# Patient Record
Sex: Female | Born: 1975 | Race: Black or African American | Hispanic: No | Marital: Single | Smoking: Former smoker
Health system: Southern US, Community
[De-identification: ages and names within clinical notes are randomized; demographics above are authoritative.]

## PROBLEM LIST (undated history)

## (undated) DIAGNOSIS — G971 Other reaction to spinal and lumbar puncture: Secondary | ICD-10-CM

## (undated) DIAGNOSIS — E11319 Type 2 diabetes mellitus with unspecified diabetic retinopathy without macular edema: Secondary | ICD-10-CM

## (undated) DIAGNOSIS — E119 Type 2 diabetes mellitus without complications: Secondary | ICD-10-CM

## (undated) DIAGNOSIS — I10 Essential (primary) hypertension: Secondary | ICD-10-CM

## (undated) DIAGNOSIS — H4311 Vitreous hemorrhage, right eye: Secondary | ICD-10-CM

## (undated) DIAGNOSIS — E1139 Type 2 diabetes mellitus with other diabetic ophthalmic complication: Secondary | ICD-10-CM

## (undated) HISTORY — DX: Type 2 diabetes mellitus with unspecified diabetic retinopathy without macular edema: E11.319

## (undated) HISTORY — DX: Essential (primary) hypertension: I10

## (undated) HISTORY — DX: Type 2 diabetes mellitus with other diabetic ophthalmic complication: E11.39

## (undated) HISTORY — DX: Vitreous hemorrhage, right eye: H43.11

## (undated) HISTORY — PX: DILATION AND CURETTAGE OF UTERUS: SHX78

---

## 2000-02-14 ENCOUNTER — Encounter: Admission: RE | Admit: 2000-02-14 | Discharge: 2000-05-14 | Payer: Self-pay | Admitting: Obstetrics and Gynecology

## 2000-02-15 ENCOUNTER — Encounter: Admission: RE | Admit: 2000-02-15 | Discharge: 2000-02-15 | Payer: Self-pay | Admitting: Obstetrics

## 2000-03-07 ENCOUNTER — Encounter: Admission: RE | Admit: 2000-03-07 | Discharge: 2000-03-07 | Payer: Self-pay | Admitting: Obstetrics

## 2000-03-28 ENCOUNTER — Observation Stay (HOSPITAL_COMMUNITY): Admission: AD | Admit: 2000-03-28 | Discharge: 2000-03-29 | Payer: Self-pay | Admitting: Obstetrics

## 2000-04-02 ENCOUNTER — Inpatient Hospital Stay (HOSPITAL_COMMUNITY): Admission: AD | Admit: 2000-04-02 | Discharge: 2000-04-05 | Payer: Self-pay | Admitting: Obstetrics & Gynecology

## 2010-07-06 ENCOUNTER — Ambulatory Visit (HOSPITAL_COMMUNITY): Admission: RE | Admit: 2010-07-06 | Discharge: 2010-07-06 | Payer: Self-pay | Admitting: Obstetrics and Gynecology

## 2011-01-13 NOTE — Discharge Summary (Signed)
[facility]  Patient:    Yvette Patel, Yvette Patel                         MRN: [id number] Adm. Date:  [id number] Disc. Date: [id number] Attending:  Antionette Char Dictator:   Pricilla Holm, M.D.                           Discharge Summary  PROCEDURES:                   Vacuum assisted vaginal delivery of preterm baby at 36 weeks and 3 days.  HOSPITAL COURSE:              The patient is a 35 year old African-American G1, P0 who presented on August 6 to triage with an intrauterine pregnancy of 36-3/7 weeks.  She started having contractions on the night before and spontaneously ruptured her membranes at 11:00 on the morning of August 6. Contractions increased in intensity during that time.  Her pregnancy has been complicated by gestational diabetes requiring insulin.  She was admitted to L&D with routine OB orders and expectant management.  The patient was given some Stadol 1 mg IV x 1 so that she could rest through the night secondary to the increased pain that she was experiencing.  On August 7, she continued to have contractions and made progress very slowly.  Her blood sugars were monitored and she was placed on an insulin protocol.  An IUPC was placed to monitor her active labor, and she was started on Pitocin.  She had an amnio infusion.  The patient was found to be complete and progressed to pushing. The fetus required vacuum assistance.  The placenta was delivered without problems.  The patient delivered a viable female weighing 6 pounds 9 ounces and 20-1/2 inches in length.  Apgars were 8 at one minute and nine at five minutes.  The infant was circumcised.  The patient was given intrapartum antibiotics because she was group B Strep positive.  The patient received routine postpartum care and had no complications and did quite well in recovery.  The patient has been breast feeding without any problems and chooses to use Micronor as birth control during  this time when she will be breast feeding.  DISCHARGE LABORATORY DATA:    Hemoglobin 11.1, hematocrit 34.6.  CONDITION ON DISCHARGE:       Good.  DISPOSITION:                  The patient is discharged to home with the father of the baby and the baby itself.  DISCHARGE MEDICATIONS:        The patient is given a prescription for Micronor to begin one tablet daily starting next Sunday and Motrin 600 mg q.6h. p.r.n. pain.  DISCHARGE INSTRUCTIONS:       1. The patient received instructions on                                  increasing activity as tolerated.                               2. She is to continue with ADA diet per her  diabetes.                               3. She is to continue to check her blood sugars                                  as indicated.                               4. Follow with insulin as needed.                               5. The patient will followup in six weeks at                                  [facility].  NOTE:                         The patient voiced agreement and understanding of the above instructions and had no further questions.  She was discharged to home. DD:  04/05/00 TD:  04/06/00 Job: [id number]

## 2011-01-13 NOTE — Discharge Summary (Signed)
[facility]  Patient:    Yvette Patel, Yvette Patel                         MRN: [id number] Adm. Date:  [id number] Disc. Date: [id number] Attending:  Tammi Sou Dictator:   Pricilla Holm, M.D.                           Discharge Summary  HISTORY OF PRESENT ILLNESS:   This is a 35 year old, G1, P0, who is 35-5/[redacted] weeks pregnant, consistent with the first ultrasound that she had on November 17, 1999.  She says that she was having some discharge, as well as some contractions during the day.  She came to the maternity admissions unit to be evaluated.  She is a gestational diabetic, which has required insulin.  The patient has been taking prenatal vitamins, iron, and Insulin N 10 units in the morning and 25 units q.h.s.  The patient had several labs run upon presentation.  Wet prep was negative and urine was negative at that time.  The patients cervical check was consistent with a cervix that was closed, long, thickened and posterior.  While in triage, the patient received one dose of terbutaline subcu and continued to have contractions, which started to worsen in intensity.  She was given a second dose of terbutaline and was admitted to antepartum for continuous monitoring.  The patient was also given approximately a liter of normal saline fluid.  HOSPITAL COURSE:              She was started on Biaxin XL and was monitored continuously through the night.  The patient did well through the night and contractions subsided early in the evening shortly after being admitted.  The patient has a GC and chlamydia, as well as group B streptococcus labs currently.  The patient will be sent home with instructions to minimize her activity, continue to drink plenty of fluids, and continue to take her prenatal vitamins as directed.  CONDITION ON DISCHARGE:       Good.  DISPOSITION:                  The patient is discharged to home.  The patient voiced agreement and  understanding of the discharge instructions and had no further question.  DISCHARGE MEDICATIONS:        The patient will continue with her prenatal vitamins.  She will continue with her insulin for her diabetes.  ACTIVITY:                     As stated above, the patient will have limited physical activity.  DIET:                         Normal ADA diet.  FOLLOW-UP:                    The patient will follow up next week with Conni Elliot, M.D., with an appointment that she already had established. DD:  03/29/00 TD:  03/31/00 Job: [id number]

## 2011-04-12 ENCOUNTER — Encounter: Payer: Self-pay | Admitting: Family Medicine

## 2011-04-12 ENCOUNTER — Emergency Department (HOSPITAL_BASED_OUTPATIENT_CLINIC_OR_DEPARTMENT_OTHER)
Admission: EM | Admit: 2011-04-12 | Discharge: 2011-04-12 | Disposition: A | Discharge status: Home / Self Care | Payer: Self-pay | Attending: Emergency Medicine | Admitting: Emergency Medicine

## 2011-04-12 DIAGNOSIS — F17200 Nicotine dependence, unspecified, uncomplicated: Secondary | ICD-10-CM | POA: Insufficient documentation

## 2011-04-12 DIAGNOSIS — M62838 Other muscle spasm: Secondary | ICD-10-CM

## 2011-04-12 DIAGNOSIS — M5380 Other specified dorsopathies, site unspecified: Secondary | ICD-10-CM | POA: Insufficient documentation

## 2011-04-12 DIAGNOSIS — M549 Dorsalgia, unspecified: Secondary | ICD-10-CM | POA: Insufficient documentation

## 2011-04-12 MED ORDER — CYCLOBENZAPRINE HCL 10 MG PO TABS: 10.0000 mg | ORAL_TABLET | Freq: Two times a day (BID) | ORAL | Status: AC | PRN

## 2011-04-12 MED ORDER — IBUPROFEN 800 MG PO TABS: 800.0000 mg | ORAL_TABLET | Freq: Three times a day (TID) | ORAL | Status: AC

## 2011-04-12 NOTE — ED Provider Notes (Signed)
History     CSN: [id number] Arrival date & time: 04/12/2011 11:04 AM  Chief Complaint  Patient presents with  . Back Pain   Patient is a 35 y.o. female presenting with back pain. The history is provided by the patient.  Back Pain  This is a new problem. Episode onset: 3 days ago. The problem occurs constantly. The problem has not changed since onset.Associated with: no known injury. Pain location: lower back, L side. The quality of the pain is described as aching. The pain does not radiate. The pain is at a severity of 7/10. The symptoms are aggravated by bending, twisting and certain positions. The pain is worse during the day. Pertinent negatives include no chest pain, no fever, no numbness, no headaches, no abdominal pain, no bowel incontinence, no perianal numbness, no bladder incontinence, no dysuria, no leg pain and no weakness. She has tried NSAIDs (tylenol) for the symptoms. The treatment provided mild relief.    History reviewed. No pertinent past medical history.  Past Surgical History  Procedure Date  . Cesarean section     No family history on file.  History  Substance Use Topics  . Smoking status: Current Everyday Smoker -- 0.5 packs/day for 10 years    Types: Cigarettes  . Smokeless tobacco: Not on file  . Alcohol Use: No    OB History    Grav Para Term Preterm Abortions TAB SAB Ect Mult Living                  Review of Systems  Constitutional: Negative for fever.  Cardiovascular: Negative for chest pain.  Gastrointestinal: Negative for abdominal pain and bowel incontinence.  Genitourinary: Negative for bladder incontinence and dysuria.  Musculoskeletal: Positive for back pain.  Neurological: Negative for weakness, numbness and headaches.    Physical Exam  BP 157/80  Pulse 83  Temp(Src) 97.8 F (36.6 C) (Oral)  Resp 16  Wt 200 lb (90.719 kg)  SpO2 100%  LMP 04/07/2011  Physical Exam  Nursing note and vitals reviewed. Constitutional: She appears  well-developed and well-nourished. No distress.  HENT:  Head: Normocephalic and atraumatic.  Eyes: Conjunctivae are normal. No scleral icterus.  Neck: Normal range of motion. Neck supple. No thyromegaly present.  Cardiovascular: Normal rate and regular rhythm.   Pulmonary/Chest: Effort normal and breath sounds normal.  Musculoskeletal: Normal range of motion. She exhibits tenderness (Mild tenderness to palpation over the left rhomboid, left lateral back, left lower back. There is no spinal tenderness and no right-sided tenderness. She positions her body such that she favors the musculature of her left back.). She exhibits no edema.  Lymphadenopathy:    She has no cervical adenopathy.  Neurological: She is alert.       Strength and sensation In the bilateral lower extremities is normal. Gait is normal, speech is normal  Skin: Skin is warm and dry. No rash noted. She is not diaphoretic.       No rash, no abscess of the back.    ED Course  Procedures  MDM Overall well-appearing, suspect some muscle spasm in her back. Patient declines intramuscular medications here, prescription is given for home. No neurologic symptoms and no red flags for back pain.      Vida Roller, MD 04/12/11 [phone]

## 2011-04-12 NOTE — ED Notes (Signed)
Pt c/o left low-mid back pain x 3 days and a "knot" that came up "about the same time". Pt denies injury. Pt sts no relief at home with epsom salt, heating pad and tylenol.

## 2011-09-28 ENCOUNTER — Emergency Department (HOSPITAL_BASED_OUTPATIENT_CLINIC_OR_DEPARTMENT_OTHER)
Admission: EM | Admit: 2011-09-28 | Discharge: 2011-09-28 | Disposition: A | Discharge status: Home / Self Care | Payer: Self-pay | Attending: Emergency Medicine | Admitting: Emergency Medicine

## 2011-09-28 ENCOUNTER — Encounter (HOSPITAL_BASED_OUTPATIENT_CLINIC_OR_DEPARTMENT_OTHER): Payer: Self-pay | Admitting: *Deleted

## 2011-09-28 DIAGNOSIS — K1370 Unspecified lesions of oral mucosa: Secondary | ICD-10-CM | POA: Insufficient documentation

## 2011-09-28 DIAGNOSIS — K047 Periapical abscess without sinus: Secondary | ICD-10-CM | POA: Insufficient documentation

## 2011-09-28 DIAGNOSIS — F17200 Nicotine dependence, unspecified, uncomplicated: Secondary | ICD-10-CM | POA: Insufficient documentation

## 2011-09-28 MED ORDER — CLINDAMYCIN HCL 150 MG PO CAPS: 150.0000 mg | ORAL_CAPSULE | Freq: Four times a day (QID) | ORAL | Status: AC

## 2011-09-28 MED ORDER — HYDROCODONE-ACETAMINOPHEN 5-500 MG PO TABS: 1.0000 | ORAL_TABLET | Freq: Four times a day (QID) | ORAL | Status: AC | PRN

## 2011-09-28 MED ORDER — CLINDAMYCIN HCL 150 MG PO CAPS: 150.0000 mg | ORAL_CAPSULE | Freq: Four times a day (QID) | ORAL | Status: DC

## 2011-09-28 NOTE — ED Provider Notes (Signed)
History     CSN: [id number]  Arrival date & time 09/28/11  1813   First MD Initiated Contact with Patient 09/28/11 1926      Chief Complaint  Patient presents with  . Oral Swelling    (Consider location/radiation/quality/duration/timing/severity/associated sxs/prior treatment) HPI Comments: Pt states that she noticed some swelling to the roof of her mouth  Patient is a 36 y.o. female presenting with tooth pain. The history is provided by the patient. No language interpreter was used.  Dental PainThe primary symptoms include mouth pain. The symptoms began 5 to 7 days ago. The symptoms are worsening. The symptoms are new. The symptoms occur constantly.  Additional symptoms do not include: trouble swallowing.    History reviewed. No pertinent past medical history.  Past Surgical History  Procedure Date  . Cesarean section     No family history on file.  History  Substance Use Topics  . Smoking status: Current Everyday Smoker -- 0.5 packs/day for 10 years    Types: Cigarettes  . Smokeless tobacco: Not on file  . Alcohol Use: No    OB History    Grav Para Term Preterm Abortions TAB SAB Ect Mult Living                  Review of Systems  Constitutional: Negative.   HENT: Negative for trouble swallowing.   Respiratory: Negative.   Cardiovascular: Negative.   Neurological: Negative.     Allergies  Chlorine; Mushroom ext cmplx(shiitake-reishi-mait); Sulfa antibiotics; Citric acid; and Penicillins  Home Medications   Current Outpatient Rx  Name Route Sig Dispense Refill  . ACETAMINOPHEN 500 MG PO TABS Oral Take 2,000 mg by mouth 3 (three) times daily as needed. For pain    . HYDROCODONE-ACETAMINOPHEN 7.5-500 MG PO TABS Oral Take 3 tablets by mouth once as needed. For pain    . NAPROXEN SODIUM 220 MG PO TABS Oral Take 880 mg by mouth 3 (three) times daily as needed. For pain    . CLINDAMYCIN HCL 150 MG PO CAPS Oral Take 1 capsule (150 mg total) by mouth every 6  (six) hours. 28 capsule 0  . HYDROCODONE-ACETAMINOPHEN 5-500 MG PO TABS Oral Take 1-2 tablets by mouth every 6 (six) hours as needed for pain. 6 tablet 0    BP 153/83  Pulse 88  Temp(Src) 98.6 F (37 C) (Oral)  Resp 20  SpO2 100%  Physical Exam  Nursing note and vitals reviewed. Constitutional: She is oriented to person, place, and time. She appears well-developed and well-nourished.  HENT:  Head: Atraumatic.  Right Ear: External ear normal.  Left Ear: External ear normal.  Mouth/Throat:    Eyes: EOM are normal.  Neck: Neck supple.  Cardiovascular: Normal rate and regular rhythm.   Pulmonary/Chest: Effort normal and breath sounds normal.  Musculoskeletal: Normal range of motion.  Neurological: She is alert and oriented to person, place, and time.    ED Course  Procedures (including critical care time)  Labs Reviewed - No data to display No results found.   1. Dental abscess       MDM  Will treat with antibiotics and refer to dentist        Teressa Lower, NP 09/28/11 2018

## 2011-09-28 NOTE — ED Provider Notes (Signed)
Medical screening examination/treatment/procedure(s) were performed by non-physician practitioner and as supervising physician I was immediately available for consultation/collaboration.   Kori Colin, MD 09/28/11 2354 

## 2011-09-28 NOTE — ED Notes (Signed)
Knot in the roof of her mouth x 1 week. Worse every day.

## 2012-04-18 ENCOUNTER — Emergency Department (HOSPITAL_BASED_OUTPATIENT_CLINIC_OR_DEPARTMENT_OTHER)
Admission: EM | Admit: 2012-04-18 | Discharge: 2012-04-18 | Disposition: A | Discharge status: Home / Self Care | Payer: Self-pay | Attending: Emergency Medicine | Admitting: Emergency Medicine

## 2012-04-18 ENCOUNTER — Encounter (HOSPITAL_BASED_OUTPATIENT_CLINIC_OR_DEPARTMENT_OTHER): Payer: Self-pay | Admitting: *Deleted

## 2012-04-18 DIAGNOSIS — Z888 Allergy status to other drugs, medicaments and biological substances status: Secondary | ICD-10-CM | POA: Insufficient documentation

## 2012-04-18 DIAGNOSIS — Z882 Allergy status to sulfonamides status: Secondary | ICD-10-CM | POA: Insufficient documentation

## 2012-04-18 DIAGNOSIS — M4802 Spinal stenosis, cervical region: Secondary | ICD-10-CM | POA: Insufficient documentation

## 2012-04-18 DIAGNOSIS — H109 Unspecified conjunctivitis: Secondary | ICD-10-CM

## 2012-04-18 DIAGNOSIS — Z91018 Allergy to other foods: Secondary | ICD-10-CM | POA: Insufficient documentation

## 2012-04-18 DIAGNOSIS — F17200 Nicotine dependence, unspecified, uncomplicated: Secondary | ICD-10-CM | POA: Insufficient documentation

## 2012-04-18 DIAGNOSIS — Z9109 Other allergy status, other than to drugs and biological substances: Secondary | ICD-10-CM | POA: Insufficient documentation

## 2012-04-18 DIAGNOSIS — Z880 Allergy status to penicillin: Secondary | ICD-10-CM | POA: Insufficient documentation

## 2012-04-18 MED ORDER — FLUORESCEIN SODIUM 1 MG OP STRP
ORAL_STRIP | OPHTHALMIC | Status: AC
  Administered 2012-04-18: 1
  Filled 2012-04-18: qty 1

## 2012-04-18 MED ORDER — TETRACAINE HCL 0.5 % OP SOLN
OPHTHALMIC | Status: AC
  Filled 2012-04-18: qty 2

## 2012-04-18 MED ORDER — HYDROCODONE-ACETAMINOPHEN 5-325 MG PO TABS: 2.0000 | ORAL_TABLET | ORAL | Status: AC | PRN

## 2012-04-18 MED ORDER — CIPROFLOXACIN HCL 0.3 % OP SOLN: 1.0000 [drp] | OPHTHALMIC | Status: AC

## 2012-04-18 NOTE — ED Provider Notes (Signed)
History     CSN: [id number]  Arrival date & time 04/18/12  1258   First MD Initiated Contact with Patient 04/18/12 1358      Chief Complaint  Patient presents with  . Eye Pain  . Eye Drainage    (Consider location/radiation/quality/duration/timing/severity/associated sxs/prior treatment) Patient is a 36 y.o. female presenting with eye pain. The history is provided by the patient. No language interpreter was used.  Eye Pain This is a new problem. The current episode started yesterday. The problem occurs constantly. The problem has been gradually worsening. Nothing aggravates the symptoms. She has tried nothing for the symptoms. The treatment provided moderate relief.  Pt complains of pain and drainage from her left eye.  Pt reports she noticed soreness after dust mopping.   Pt works in a daycare.   Pt has been exposed to pink eye  History reviewed. No pertinent past medical history.  Past Surgical History  Procedure Date  . Cesarean section     History reviewed. No pertinent family history.  History  Substance Use Topics  . Smoking status: Current Everyday Smoker -- 0.5 packs/day for 10 years    Types: Cigarettes  . Smokeless tobacco: Not on file  . Alcohol Use: No    OB History    Grav Para Term Preterm Abortions TAB SAB Ect Mult Living                  Review of Systems  Eyes: Positive for pain, discharge and redness.  All other systems reviewed and are negative.    Allergies  Chlorine; Mushroom ext cmplx(shiitake-reishi-mait); Sulfa antibiotics; Citric acid; and Penicillins  Home Medications   Current Outpatient Rx  Name Route Sig Dispense Refill  . ACETAMINOPHEN 500 MG PO TABS Oral Take 2,000 mg by mouth 3 (three) times daily as needed. For pain    . HYDROCODONE-ACETAMINOPHEN 7.5-500 MG PO TABS Oral Take 3 tablets by mouth once as needed. For pain    . NAPROXEN SODIUM 220 MG PO TABS Oral Take 880 mg by mouth 3 (three) times daily as needed. For pain       BP 131/73  Pulse 89  Temp 98.2 F (36.8 C) (Oral)  Resp 16  Ht 5\' 9"  (1.753 m)  Wt 190 lb (86.183 kg)  BMI 28.06 kg/m2  LMP 04/13/2012  Physical Exam  Nursing note and vitals reviewed. Constitutional: She is oriented to person, place, and time. She appears well-developed and well-nourished.  HENT:  Head: Normocephalic and atraumatic.  Eyes: EOM are normal. Pupils are equal, round, and reactive to light. Right eye exhibits discharge.       Exudate lower eyelid,   fluro no foreign body.   Neck: Normal range of motion. Neck supple.  Musculoskeletal: Normal range of motion.  Neurological: She is alert and oriented to person, place, and time. She has normal reflexes.  Skin: Skin is warm.  Psychiatric: She has a normal mood and affect.    ED Course  Procedures (including critical care time)  Labs Reviewed - No data to display No results found.   No diagnosis found.    MDM  I will treat with ciloxan and hydrocodone.  Pt advised to see her MD for recheck in 2 days        Yvette Patel, [place] 04/18/12 1428

## 2012-04-18 NOTE — ED Provider Notes (Signed)
Medical screening examination/treatment/procedure(s) were performed by non-physician practitioner and as supervising physician I was immediately available for consultation/collaboration.   Charles B. Bernette Mayers, MD 04/18/12 1429

## 2012-04-18 NOTE — ED Notes (Signed)
Pt states that two days ago at work she was sweeping at work and dust got into her eye.  She irrigated the eye.  Began experiencing pain and swelling.  Pt states that she is not able to open the left eye today and there is clear discharge coming from the left eye.  Pt states that she is light sensitive but denies nausea.  States that she will occasionally experience dizziness.
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
## 2015-04-17 ENCOUNTER — Emergency Department (HOSPITAL_BASED_OUTPATIENT_CLINIC_OR_DEPARTMENT_OTHER)
Admission: EM | Admit: 2015-04-17 | Discharge: 2015-04-18 | Disposition: A | Discharge status: Home / Self Care | Payer: No Typology Code available for payment source | Attending: Emergency Medicine | Admitting: Emergency Medicine

## 2015-04-17 ENCOUNTER — Encounter (HOSPITAL_BASED_OUTPATIENT_CLINIC_OR_DEPARTMENT_OTHER): Payer: Self-pay | Admitting: *Deleted

## 2015-04-17 DIAGNOSIS — O200 Threatened abortion: Secondary | ICD-10-CM | POA: Insufficient documentation

## 2015-04-17 DIAGNOSIS — Z3A10 10 weeks gestation of pregnancy: Secondary | ICD-10-CM | POA: Insufficient documentation

## 2015-04-17 DIAGNOSIS — Z880 Allergy status to penicillin: Secondary | ICD-10-CM | POA: Insufficient documentation

## 2015-04-17 DIAGNOSIS — O24111 Pre-existing diabetes mellitus, type 2, in pregnancy, first trimester: Secondary | ICD-10-CM | POA: Diagnosis not present

## 2015-04-17 DIAGNOSIS — Z87891 Personal history of nicotine dependence: Secondary | ICD-10-CM | POA: Insufficient documentation

## 2015-04-17 DIAGNOSIS — O039 Complete or unspecified spontaneous abortion without complication: Secondary | ICD-10-CM

## 2015-04-17 HISTORY — DX: Type 2 diabetes mellitus without complications: E11.9

## 2015-04-17 LAB — URINALYSIS, ROUTINE W REFLEX MICROSCOPIC
Bilirubin Urine: NEGATIVE
KETONES UR: NEGATIVE mg/dL
NITRITE: NEGATIVE
PH: 6 (ref 5.0–8.0)
Protein, ur: 100 mg/dL — AB
SPECIFIC GRAVITY, URINE: 1.029 (ref 1.005–1.030)
Urobilinogen, UA: 1 mg/dL (ref 0.0–1.0)

## 2015-04-17 LAB — URINE MICROSCOPIC-ADD ON

## 2015-04-17 LAB — PREGNANCY, URINE: PREG TEST UR: NEGATIVE

## 2015-04-17 NOTE — ED Notes (Signed)
Pt states that she is [redacted] weeks pregnant and has been bleeding today. States she has soaked "one pad" since this morning. States this is the first time she has had any vaginal bleeding with pregnancy. C/o abd cramping.

## 2015-04-18 ENCOUNTER — Ambulatory Visit (HOSPITAL_BASED_OUTPATIENT_CLINIC_OR_DEPARTMENT_OTHER)
Admit: 2015-04-18 | Discharge: 2015-04-18 | Disposition: A | Discharge status: Home / Self Care | Payer: No Typology Code available for payment source | Attending: Emergency Medicine | Admitting: Emergency Medicine

## 2015-04-18 ENCOUNTER — Other Ambulatory Visit (HOSPITAL_BASED_OUTPATIENT_CLINIC_OR_DEPARTMENT_OTHER): Payer: Self-pay | Admitting: Emergency Medicine

## 2015-04-18 DIAGNOSIS — N939 Abnormal uterine and vaginal bleeding, unspecified: Secondary | ICD-10-CM

## 2015-04-18 LAB — HCG, QUANTITATIVE, PREGNANCY

## 2015-04-18 LAB — ABO/RH: ABO/RH(D): A POS

## 2015-04-18 NOTE — ED Provider Notes (Signed)
CSN: [id number]     Arrival date & time 04/17/15  2311 History   First MD Initiated Contact with Patient 04/18/15 0157     Chief Complaint  Patient presents with  . Vaginal Bleeding     (Consider location/radiation/quality/duration/timing/severity/associated sxs/prior Treatment) HPI  This is a 39 year old female who who states she is about [redacted] weeks pregnant. She developed vaginal bleeding yesterday which was accompanied by passage of a mass of tissue as well as clots. There was moderate to severe associated menstrual-like cramping. The cramping has subsequently improved and her bleeding has slowed down. She states she has only used one pad. Bleeding is currently less heavy than a period but was more severe yesterday.  Past Medical History  Diagnosis Date  . Diabetes mellitus without complication    Past Surgical History  Procedure Laterality Date  . Cesarean section     No family history on file. Social History  Substance Use Topics  . Smoking status: Former Smoker -- 0.00 packs/day for 0 years  . Smokeless tobacco: None  . Alcohol Use: No   OB History    No data available     Review of Systems  All other systems reviewed and are negative.   Allergies  Chlorine; Mushroom ext cmplx(shiitake-reishi-mait); Sulfa antibiotics; Citric acid; and Penicillins  Home Medications   Prior to Admission medications   Medication Sig Start Date End Date Taking? Authorizing Provider  Prenatal Vit-Fe Fumarate-FA (PRENATAL VITAMIN PO) Take by mouth.   Yes Historical Provider, MD  acetaminophen (TYLENOL) 500 MG tablet Take 2,000 mg by mouth 3 (three) times daily as needed. For pain    Historical Provider, MD  HYDROcodone-acetaminophen (LORTAB) 7.5-500 MG per tablet Take 3 tablets by mouth once as needed. For pain    Historical Provider, MD  naproxen sodium (ANAPROX) 220 MG tablet Take 880 mg by mouth 3 (three) times daily as needed. For pain    Historical Provider, MD   BP 168/71 mmHg   Pulse 74  Temp(Src) 97.7 F (36.5 C) (Oral)  Resp 18  Ht 5\' 10"  (1.778 m)  Wt 195 lb (88.451 kg)  BMI 27.98 kg/m2  SpO2 99%  LMP 12/27/2014   Physical Exam  General: Well-developed, well-nourished female in no acute distress; appearance consistent with age of record HENT: normocephalic; atraumatic Eyes: pupils equal, round and reactive to light; extraocular muscles intact Neck: supple Heart: regular rate and rhythm Lungs: clear to auscultation bilaterally Abdomen: soft; nondistended; mild suprapubic tenderness; no masses or hepatosplenomegaly; bowel sounds present GU: No CVA tenderness; no IUP seen on bedside ultrasound Extremities: No deformity; full range of motion; pulses normal Neurologic: Awake, alert and oriented; motor function intact in all extremities and symmetric; no facial droop Skin: Warm and dry Psychiatric: Normal mood and affect    ED Course  Procedures (including critical care time)   MDM   Nursing notes and vitals signs, including pulse oximetry, reviewed.  Summary of this visit's results, reviewed by myself:  Labs:  Results for orders placed or performed during the hospital encounter of 04/17/15 (from the past 24 hour(s))  Pregnancy, urine     Status: None   Collection Time: 04/17/15 11:00 PM  Result Value Ref Range   Preg Test, Ur NEGATIVE NEGATIVE  Urinalysis, Routine w reflex microscopic (not at [facility])     Status: Abnormal   Collection Time: 04/17/15 11:00 PM  Result Value Ref Range   Color, Urine YELLOW YELLOW   APPearance CLEAR CLEAR   Specific  Gravity, Urine 1.029 1.005 - 1.030   pH 6.0 5.0 - 8.0   Glucose, UA >1000 (A) NEGATIVE mg/dL   Hgb urine dipstick LARGE (A) NEGATIVE   Bilirubin Urine NEGATIVE NEGATIVE   Ketones, ur NEGATIVE NEGATIVE mg/dL   Protein, ur 161 (A) NEGATIVE mg/dL   Urobilinogen, UA 1.0 0.0 - 1.0 mg/dL   Nitrite NEGATIVE NEGATIVE   Leukocytes, UA SMALL (A) NEGATIVE  Urine microscopic-add on     Status: Abnormal    Collection Time: 04/17/15 11:00 PM  Result Value Ref Range   Squamous Epithelial / LPF RARE RARE   WBC, UA 7-10 <3 WBC/hpf   RBC / HPF 11-20 <3 RBC/hpf   Bacteria, UA FEW (A) RARE   Urine-Other MUCOUS PRESENT   hCG, quantitative, pregnancy     Status: None   Collection Time: 04/18/15 12:15 AM  Result Value Ref Range   hCG, Beta Chain, Quant, S <1 <5 mIU/mL   Suspect intrauterine fetal demise with delayed passage of products of conception. We will have her return for formal ultrasound later today.     Paula Libra, MD 04/18/15 [phone]

## 2015-04-18 NOTE — Discharge Instructions (Signed)
Miscarriage A miscarriage is the sudden loss of an unborn baby (fetus) before the 20th week of pregnancy. Most miscarriages happen in the first 3 months of pregnancy. Sometimes, it happens before a woman even knows she is pregnant. A miscarriage is also called a "spontaneous miscarriage" or "early pregnancy loss." Having a miscarriage can be an emotional experience. Talk with your caregiver about any questions you may have about miscarrying, the grieving process, and your future pregnancy plans. CAUSES   Problems with the fetal chromosomes that make it impossible for the baby to develop normally. Problems with the baby's genes or chromosomes are most often the result of errors that occur, by chance, as the embryo divides and grows. The problems are not inherited from the parents.  Infection of the cervix or uterus.   Hormone problems.   Problems with the cervix, such as having an incompetent cervix. This is when the tissue in the cervix is not strong enough to hold the pregnancy.   Problems with the uterus, such as an abnormally shaped uterus, uterine fibroids, or congenital abnormalities.   Certain medical conditions.   Smoking, drinking alcohol, or taking illegal drugs.   Trauma.  Often, the cause of a miscarriage is unknown.  SYMPTOMS   Vaginal bleeding or spotting, with or without cramps or pain.  Pain or cramping in the abdomen or lower back.  Passing fluid, tissue, or blood clots from the vagina. DIAGNOSIS  Your caregiver will perform a physical exam. You may also have an ultrasound to confirm the miscarriage. Blood or urine tests may also be ordered. TREATMENT   Sometimes, treatment is not necessary if you naturally pass all the fetal tissue that was in the uterus. If some of the fetus or placenta remains in the body (incomplete miscarriage), tissue left behind may become infected and must be removed. Usually, a dilation and curettage (D and C) procedure is performed.  During a D and C procedure, the cervix is widened (dilated) and any remaining fetal or placental tissue is gently removed from the uterus.  Antibiotic medicines are prescribed if there is an infection. Other medicines may be given to reduce the size of the uterus (contract) if there is a lot of bleeding.  If you have Rh negative blood and your baby was Rh positive, you will need a Rh immunoglobulin shot. This shot will protect any future baby from having Rh blood problems in future pregnancies. HOME CARE INSTRUCTIONS   Your caregiver may order bed rest or may allow you to continue light activity. Resume activity as directed by your caregiver.  Have someone help with home and family responsibilities during this time.   Keep track of the number of sanitary pads you use each day and how soaked (saturated) they are. Write down this information.   Do not use tampons. Do not douche or have sexual intercourse until approved by your caregiver.   Only take over-the-counter or prescription medicines for pain or discomfort as directed by your caregiver.   Do not take aspirin. Aspirin can cause bleeding.   Keep all follow-up appointments with your caregiver.   If you or your partner have problems with grieving, talk to your caregiver or seek counseling to help cope with the pregnancy loss. Allow enough time to grieve before trying to get pregnant again.  SEEK IMMEDIATE MEDICAL CARE IF:   You have severe cramps or pain in your back or abdomen.  You have a fever.  You pass large blood clots (walnut-sized   or larger) ortissue from your vagina. Save any tissue for your caregiver to inspect.   Your bleeding increases.   You have a thick, bad-smelling vaginal discharge.  You become lightheaded, weak, or you faint.   You have chills.  MAKE SURE YOU:  Understand these instructions.  Will watch your condition.  Will get help right away if you are not doing well or get  worse. Document Released: 02/07/2001 Document Revised: 12/09/2012 Document Reviewed: 10/03/2011 [employer] Patient Information 2015 [employer]. This information is not intended to replace advice given to you by your health care provider. Make sure you discuss any questions you have with your health care provider.  

## 2015-04-26 ENCOUNTER — Encounter (HOSPITAL_BASED_OUTPATIENT_CLINIC_OR_DEPARTMENT_OTHER): Payer: Self-pay | Admitting: *Deleted

## 2015-04-26 ENCOUNTER — Emergency Department (HOSPITAL_BASED_OUTPATIENT_CLINIC_OR_DEPARTMENT_OTHER)
Admission: EM | Admit: 2015-04-26 | Discharge: 2015-04-26 | Disposition: A | Discharge status: Home / Self Care | Payer: No Typology Code available for payment source | Attending: Emergency Medicine | Admitting: Emergency Medicine

## 2015-04-26 DIAGNOSIS — Y92410 Unspecified street and highway as the place of occurrence of the external cause: Secondary | ICD-10-CM | POA: Diagnosis not present

## 2015-04-26 DIAGNOSIS — Y9389 Activity, other specified: Secondary | ICD-10-CM | POA: Diagnosis not present

## 2015-04-26 DIAGNOSIS — Z87891 Personal history of nicotine dependence: Secondary | ICD-10-CM | POA: Insufficient documentation

## 2015-04-26 DIAGNOSIS — E119 Type 2 diabetes mellitus without complications: Secondary | ICD-10-CM | POA: Diagnosis not present

## 2015-04-26 DIAGNOSIS — Y998 Other external cause status: Secondary | ICD-10-CM | POA: Diagnosis not present

## 2015-04-26 DIAGNOSIS — S299XXA Unspecified injury of thorax, initial encounter: Secondary | ICD-10-CM | POA: Insufficient documentation

## 2015-04-26 DIAGNOSIS — Z880 Allergy status to penicillin: Secondary | ICD-10-CM | POA: Insufficient documentation

## 2015-04-26 DIAGNOSIS — S3992XA Unspecified injury of lower back, initial encounter: Secondary | ICD-10-CM | POA: Diagnosis not present

## 2015-04-26 DIAGNOSIS — M549 Dorsalgia, unspecified: Secondary | ICD-10-CM
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
## 2016-02-18 ENCOUNTER — Encounter (HOSPITAL_COMMUNITY): Payer: Self-pay

## 2016-02-18 DIAGNOSIS — R112 Nausea with vomiting, unspecified: Secondary | ICD-10-CM | POA: Insufficient documentation

## 2016-02-18 DIAGNOSIS — R197 Diarrhea, unspecified: Secondary | ICD-10-CM | POA: Insufficient documentation

## 2016-02-18 DIAGNOSIS — Z87891 Personal history of nicotine dependence: Secondary | ICD-10-CM | POA: Insufficient documentation

## 2016-02-18 DIAGNOSIS — E119 Type 2 diabetes mellitus without complications: Secondary | ICD-10-CM | POA: Insufficient documentation

## 2016-02-18 LAB — CBC
HCT: 34.2 % — ABNORMAL LOW (ref 36.0–46.0)
HEMOGLOBIN: 11.1 g/dL — AB (ref 12.0–15.0)
MCH: 26.4 pg (ref 26.0–34.0)
MCHC: 32.5 g/dL (ref 30.0–36.0)
MCV: 81.4 fL (ref 78.0–100.0)
Platelets: 157 10*3/uL (ref 150–400)
RBC: 4.2 MIL/uL (ref 3.87–5.11)
RDW: 13.7 % (ref 11.5–15.5)
WBC: 7.9 10*3/uL (ref 4.0–10.5)

## 2016-02-18 LAB — URINE MICROSCOPIC-ADD ON

## 2016-02-18 LAB — COMPREHENSIVE METABOLIC PANEL
ALBUMIN: 3.7 g/dL (ref 3.5–5.0)
ALK PHOS: 47 U/L (ref 38–126)
ALT: 23 U/L (ref 14–54)
ANION GAP: 8 (ref 5–15)
AST: 26 U/L (ref 15–41)
BUN: 11 mg/dL (ref 6–20)
CALCIUM: 9.5 mg/dL (ref 8.9–10.3)
CHLORIDE: 107 mmol/L (ref 101–111)
CO2: 25 mmol/L (ref 22–32)
CREATININE: 0.86 mg/dL (ref 0.44–1.00)
GFR calc Af Amer: >60 mL/min (ref >60)
GFR calc non Af Amer: >60 mL/min (ref >60)
GLUCOSE: 148 mg/dL — AB (ref 65–99)
Potassium: 3.8 mmol/L (ref 3.5–5.1)
SODIUM: 140 mmol/L (ref 135–145)
Total Bilirubin: 0.5 mg/dL (ref 0.3–1.2)
Total Protein: 6.9 g/dL (ref 6.5–8.1)

## 2016-02-18 LAB — POC URINE PREG, ED: PREG TEST UR: NEGATIVE

## 2016-02-18 LAB — URINALYSIS, ROUTINE W REFLEX MICROSCOPIC
BILIRUBIN URINE: NEGATIVE
GLUCOSE, UA: NEGATIVE mg/dL
Ketones, ur: 15 mg/dL — AB
Leukocytes, UA: NEGATIVE
Nitrite: NEGATIVE
PH: 5.5 (ref 5.0–8.0)
Protein, ur: 100 mg/dL — AB
SPECIFIC GRAVITY, URINE: 1.028 (ref 1.005–1.030)

## 2016-02-18 LAB — LIPASE, BLOOD: Lipase: 42 U/L (ref 11–51)

## 2016-02-18 NOTE — ED Notes (Signed)
Pt states she ate a hotdog today and noticed that the bread was molded; Pt states a few hours after she started vomiting and having diarrhea. Pt states he vomited x 4 and diarrhea x 3; Pt states she feels tightness in abdomen; pt states pain at 8/10 on arrival; pt a&o x 4 on arrival. Pt states she feel like her throat is swollen; pt states she is able to swallow but does have some throat discomfort.

## 2016-02-19 ENCOUNTER — Emergency Department (HOSPITAL_COMMUNITY)
Admission: EM | Admit: 2016-02-19 | Discharge: 2016-02-19 | Disposition: A | Discharge status: Home / Self Care | Payer: No Typology Code available for payment source | Attending: Emergency Medicine | Admitting: Emergency Medicine

## 2016-02-19 DIAGNOSIS — R197 Diarrhea, unspecified: Secondary | ICD-10-CM

## 2016-02-19 DIAGNOSIS — R112 Nausea with vomiting, unspecified: Secondary | ICD-10-CM

## 2016-02-19 MED ORDER — ONDANSETRON 8 MG PO TBDP: 8.0000 mg | ORAL_TABLET | Freq: Three times a day (TID) | ORAL | Status: DC | PRN

## 2016-02-19 MED ORDER — ONDANSETRON 4 MG PO TBDP
8.0000 mg | ORAL_TABLET | Freq: Once | ORAL | Status: AC
  Administered 2016-02-19: 8 mg via ORAL
  Filled 2016-02-19: qty 2

## 2016-02-19 NOTE — ED Provider Notes (Signed)
CSN: [id number]     Arrival date & time 02/18/16  2125 History   First MD Initiated Contact with Patient 02/19/16 0225     Chief Complaint  Patient presents with  . Abdominal Pain  . Nausea  . Vomiting  . Diarrhea     (Consider location/radiation/quality/duration/timing/severity/associated sxs/prior Treatment) HPI Comments: The patient presents with nausea, vomiting and diarrhea that started about 5 hours after eating a bite of a hot dog that she subsequently found to have mold throughout. No fever. She has abdominal cramping. No hematemesis or bloody stools.   Patient is a 40 y.o. female presenting with abdominal pain and diarrhea. The history is provided by the patient. No language interpreter was used.  Abdominal Pain Pain location:  Generalized Pain quality: cramping   Associated symptoms: diarrhea, nausea and vomiting   Associated symptoms: no chest pain, no chills, no dysuria, no fever and no shortness of breath   Diarrhea Associated symptoms: abdominal pain and vomiting   Associated symptoms: no chills and no fever     Past Medical History  Diagnosis Date  . Diabetes mellitus without complication [facility](HCC)    Past Surgical History  Procedure Laterality Date  . Cesarean section     No family history on file. Social History  Substance Use Topics  . Smoking status: Former Smoker -- 0.00 packs/day for 0 years  . Smokeless tobacco: None  . Alcohol Use: No   OB History    No data available     Review of Systems  Constitutional: Negative for fever and chills.  Respiratory: Negative.  Negative for shortness of breath.   Cardiovascular: Negative.  Negative for chest pain.  Gastrointestinal: Positive for nausea, vomiting, abdominal pain and diarrhea. Negative for blood in stool.  Genitourinary: Negative.  Negative for dysuria.  Musculoskeletal: Negative.   Skin: Negative.   Neurological: Negative.       Allergies  Chlorine; Mushroom ext cmplx(shiitake-reishi-mait);  Sulfa antibiotics; Citric acid; and Penicillins  Home Medications   Prior to Admission medications   Medication Sig Start Date End Date Taking? Authorizing Provider  Prenatal Vit-Fe Fumarate-FA (PRENATAL VITAMIN PO) Take 1 tablet by mouth daily.    Yes Historical Provider, MD   BP 152/101 mmHg  Pulse 93  Temp(Src) 98.6 F (37 C) (Oral)  Resp 18  SpO2 100%  LMP 02/16/2016 (Exact Date) Physical Exam  Constitutional: She is oriented to person, place, and time. She appears well-developed and well-nourished.  HENT:  Head: Normocephalic.  Neck: Normal range of motion. Neck supple.  Cardiovascular: Normal rate and regular rhythm.   Pulmonary/Chest: Effort normal and breath sounds normal.  Abdominal: Soft. Bowel sounds are normal. There is no tenderness. There is no rebound and no guarding.  Musculoskeletal: Normal range of motion.  Neurological: She is alert and oriented to person, place, and time.  Skin: Skin is warm and dry. No rash noted.  Psychiatric: She has a normal mood and affect.    ED Course  Procedures (including critical care time) Labs Review Labs Reviewed  COMPREHENSIVE METABOLIC PANEL - Abnormal; Notable for the following:    Glucose, Bld 148 (*)    All other components within normal limits  CBC - Abnormal; Notable for the following:    Hemoglobin 11.1 (*)    HCT 34.2 (*)    All other components within normal limits  URINALYSIS, ROUTINE W REFLEX MICROSCOPIC (NOT AT [facility]) - Abnormal; Notable for the following:    Hgb urine dipstick LARGE (*)  Ketones, ur 15 (*)    Protein, ur 100 (*)    All other components within normal limits  URINE MICROSCOPIC-ADD ON - Abnormal; Notable for the following:    Squamous Epithelial / LPF 6-30 (*)    Bacteria, UA FEW (*)    Casts HYALINE CASTS (*)    All other components within normal limits  LIPASE, BLOOD  POC URINE PREG, ED   Results for orders placed or performed during the hospital encounter of 02/19/16  Lipase, blood   Result Value Ref Range   Lipase 42 11 - 51 U/L  Comprehensive metabolic panel  Result Value Ref Range   Sodium 140 135 - 145 mmol/L   Potassium 3.8 3.5 - 5.1 mmol/L   Chloride 107 101 - 111 mmol/L   CO2 25 22 - 32 mmol/L   Glucose, Bld 148 (H) 65 - 99 mg/dL   BUN 11 6 - 20 mg/dL   Creatinine, Ser 1.610.86 0.44 - 1.00 mg/dL   Calcium 9.5 8.9 - 09.610.3 mg/dL   Total Protein 6.9 6.5 - 8.1 g/dL   Albumin 3.7 3.5 - 5.0 g/dL   AST 26 15 - 41 U/L   ALT 23 14 - 54 U/L   Alkaline Phosphatase 47 38 - 126 U/L   Total Bilirubin 0.5 0.3 - 1.2 mg/dL   GFR calc non Af Amer >60 >60 mL/min   GFR calc Af Amer >60 >60 mL/min   Anion gap 8 5 - 15  CBC  Result Value Ref Range   WBC 7.9 4.0 - 10.5 K/uL   RBC 4.20 3.87 - 5.11 MIL/uL   Hemoglobin 11.1 (L) 12.0 - 15.0 g/dL   HCT 04.534.2 (L) 40.936.0 - 81.146.0 %   MCV 81.4 78.0 - 100.0 fL   MCH 26.4 26.0 - 34.0 pg   MCHC 32.5 30.0 - 36.0 g/dL   RDW 91.413.7 78.211.5 - 95.615.5 %   Platelets 157 150 - 400 K/uL  Urinalysis, Routine w reflex microscopic  Result Value Ref Range   Color, Urine YELLOW YELLOW   APPearance CLEAR CLEAR   Specific Gravity, Urine 1.028 1.005 - 1.030   pH 5.5 5.0 - 8.0   Glucose, UA NEGATIVE NEGATIVE mg/dL   Hgb urine dipstick LARGE (A) NEGATIVE   Bilirubin Urine NEGATIVE NEGATIVE   Ketones, ur 15 (A) NEGATIVE mg/dL   Protein, ur 213100 (A) NEGATIVE mg/dL   Nitrite NEGATIVE NEGATIVE   Leukocytes, UA NEGATIVE NEGATIVE  Urine microscopic-add on  Result Value Ref Range   Squamous Epithelial / LPF 6-30 (A) NONE SEEN   WBC, UA 0-5 0 - 5 WBC/hpf   RBC / HPF TOO NUMEROUS TO COUNT 0 - 5 RBC/hpf   Bacteria, UA FEW (A) NONE SEEN   Casts HYALINE CASTS (A) NEGATIVE   Urine-Other MUCOUS PRESENT   POC urine preg, ED  Result Value Ref Range   Preg Test, Ur NEGATIVE NEGATIVE    Imaging Review No results found. I have personally reviewed and evaluated these images and lab results as part of my medical decision-making.   EKG Interpretation None       MDM   Final diagnoses:  None    1. Nausea, vomiting, diarrhea  Patient presents with N, V, D after bad food exposure. VSS, afebrile. Tolerating PO fluids after Zofran. No further diarrhea. She is felt stable for discharge home.     Elpidio AnisShari Ellen Goris, PA-C 02/19/16 0427  Layla MawKristen N Ward, DO 02/19/16 [phone]

## 2016-02-19 NOTE — Discharge Instructions (Signed)
Food Choices to Help Relieve Diarrhea, Adult  When you have diarrhea, the foods you eat and your eating habits are very important. Choosing the right foods and drinks can help relieve diarrhea. Also, because diarrhea can last up to 7 days, you need to replace lost fluids and electrolytes (such as sodium, potassium, and chloride) in order to help prevent dehydration.   WHAT GENERAL GUIDELINES DO I NEED TO FOLLOW?  · Slowly drink 1 cup (8 oz) of fluid for each episode of diarrhea. If you are getting enough fluid, your urine will be clear or pale yellow.  · Eat starchy foods. Some good choices include white rice, white toast, pasta, low-fiber cereal, baked potatoes (without the skin), saltine crackers, and bagels.  · Avoid large servings of any cooked vegetables.  · Limit fruit to two servings per day. A serving is ½ cup or 1 small piece.  · Choose foods with less than 2 g of fiber per serving.  · Limit fats to less than 8 tsp (38 g) per day.  · Avoid fried foods.  · Eat foods that have probiotics in them. Probiotics can be found in certain dairy products.  · Avoid foods and beverages that may increase the speed at which food moves through the stomach and intestines (gastrointestinal tract). Things to avoid include:  ¨ High-fiber foods, such as dried fruit, raw fruits and vegetables, nuts, seeds, and whole grain foods.  ¨ Spicy foods and high-fat foods.  ¨ Foods and beverages sweetened with high-fructose corn syrup, honey, or sugar alcohols such as xylitol, sorbitol, and mannitol.  WHAT FOODS ARE RECOMMENDED?  Grains  White rice. White, French, or pita breads (fresh or toasted), including plain rolls, buns, or bagels. White pasta. Saltine, soda, or graham crackers. Pretzels. Low-fiber cereal. Cooked cereals made with water (such as cornmeal, farina, or cream cereals). Plain muffins. Matzo. Melba toast. Zwieback.   Vegetables  Potatoes (without the skin). Strained tomato and vegetable juices. Most well-cooked and canned  vegetables without seeds. Tender lettuce.  Fruits  Cooked or canned applesauce, apricots, cherries, fruit cocktail, grapefruit, peaches, pears, or plums. Fresh bananas, apples without skin, cherries, grapes, cantaloupe, grapefruit, peaches, oranges, or plums.   Meat and Other Protein Products  Baked or boiled chicken. Eggs. Tofu. Fish. Seafood. Smooth peanut butter. Ground or well-cooked tender beef, ham, veal, lamb, pork, or poultry.   Dairy  Plain yogurt, kefir, and unsweetened liquid yogurt. Lactose-free milk, buttermilk, or soy milk. Plain hard cheese.  Beverages  Sport drinks. Clear broths. Diluted fruit juices (except prune). Regular, caffeine-free sodas such as ginger ale. Water. Decaffeinated teas. Oral rehydration solutions. Sugar-free beverages not sweetened with sugar alcohols.  Other  Bouillon, broth, or soups made from recommended foods.   The items listed above may not be a complete list of recommended foods or beverages. Contact your dietitian for more options.  WHAT FOODS ARE NOT RECOMMENDED?  Grains  Whole grain, whole wheat, bran, or rye breads, rolls, pastas, crackers, and cereals. Wild or brown rice. Cereals that contain more than 2 g of fiber per serving. Corn tortillas or taco shells. Cooked or dry oatmeal. Granola. Popcorn.  Vegetables  Raw vegetables. Cabbage, broccoli, Brussels sprouts, artichokes, baked beans, beet greens, corn, kale, legumes, peas, sweet potatoes, and yams. Potato skins. Cooked spinach and cabbage.  Fruits  Dried fruit, including raisins and dates. Raw fruits. Stewed or dried prunes. Fresh apples with skin, apricots, mangoes, pears, raspberries, and strawberries.   Meat and Other Protein Products    Chunky peanut butter. Nuts and seeds. Beans and lentils. Bacon.   Dairy  High-fat cheeses. Milk, chocolate milk, and beverages made with milk, such as milk shakes. Cream. Ice cream.  Sweets and Desserts  Sweet rolls, doughnuts, and sweet breads. Pancakes and waffles.  Fats and  Oils  Butter. Cream sauces. Margarine. Salad oils. Plain salad dressings. Olives. Avocados.   Beverages  Caffeinated beverages (such as coffee, tea, soda, or energy drinks). Alcoholic beverages. Fruit juices with pulp. Prune juice. Soft drinks sweetened with high-fructose corn syrup or sugar alcohols.  Other  Coconut. Hot sauce. Chili powder. Mayonnaise. Gravy. Cream-based or milk-based soups.   The items listed above may not be a complete list of foods and beverages to avoid. Contact your dietitian for more information.  WHAT SHOULD I DO IF I BECOME DEHYDRATED?  Diarrhea can sometimes lead to dehydration. Signs of dehydration include dark urine and dry mouth and skin. If you think you are dehydrated, you should rehydrate with an oral rehydration solution. These solutions can be purchased at pharmacies, retail stores, or online.   Drink ½-1 cup (120-240 mL) of oral rehydration solution each time you have an episode of diarrhea. If drinking this amount makes your diarrhea worse, try drinking smaller amounts more often. For example, drink 1-3 tsp (5-15 mL) every 5-10 minutes.   A general rule for staying hydrated is to drink 1½-2 L of fluid per day. Talk to your health care provider about the specific amount you should be drinking each day. Drink enough fluids to keep your urine clear or pale yellow.     This information is not intended to replace advice given to you by your health care provider. Make sure you discuss any questions you have with your health care provider.     Document Released: 11/04/2003 Document Revised: 09/04/2014 Document Reviewed: 07/07/2013  Elsevier Interactive Patient Education ©2016 Elsevier Inc.  Nausea and Vomiting  Nausea is a sick feeling that often comes before throwing up (vomiting). Vomiting is a reflex where stomach contents come out of your mouth. Vomiting can cause severe loss of body fluids (dehydration). Children and elderly adults can become dehydrated quickly, especially if they  also have diarrhea. Nausea and vomiting are symptoms of a condition or disease. It is important to find the cause of your symptoms.  CAUSES   · Direct irritation of the stomach lining. This irritation can result from increased acid production (gastroesophageal reflux disease), infection, food poisoning, taking certain medicines (such as nonsteroidal anti-inflammatory drugs), alcohol use, or tobacco use.  · Signals from the brain. These signals could be caused by a headache, heat exposure, an inner ear disturbance, increased pressure in the brain from injury, infection, a tumor, or a concussion, pain, emotional stimulus, or metabolic problems.  · An obstruction in the gastrointestinal tract (bowel obstruction).  · Illnesses such as diabetes, hepatitis, gallbladder problems, appendicitis, kidney problems, cancer, sepsis, atypical symptoms of a heart attack, or eating disorders.  · Medical treatments such as chemotherapy and radiation.  · Receiving medicine that makes you sleep (general anesthetic) during surgery.  DIAGNOSIS  Your caregiver may ask for tests to be done if the problems do not improve after a few days. Tests may also be done if symptoms are severe or if the reason for the nausea and vomiting is not clear. Tests may include:  · Urine tests.  · Blood tests.  · Stool tests.  · Cultures (to look for evidence of infection).  · X-rays or other   imaging studies.  Test results can help your caregiver make decisions about treatment or the need for additional tests.  TREATMENT  You need to stay well hydrated. Drink frequently but in small amounts. You may wish to drink water, sports drinks, clear broth, or eat frozen ice pops or gelatin dessert to help stay hydrated. When you eat, eating slowly may help prevent nausea. There are also some antinausea medicines that may help prevent nausea.  HOME CARE INSTRUCTIONS   · Take all medicine as directed by your caregiver.  · If you do not have an appetite, do not force  yourself to eat. However, you must continue to drink fluids.  · If you have an appetite, eat a normal diet unless your caregiver tells you differently.    Eat a variety of complex carbohydrates (rice, wheat, potatoes, bread), lean meats, yogurt, fruits, and vegetables.    Avoid high-fat foods because they are more difficult to digest.  · Drink enough water and fluids to keep your urine clear or pale yellow.  · If you are dehydrated, ask your caregiver for specific rehydration instructions. Signs of dehydration may include:    Severe thirst.    Dry lips and mouth.    Dizziness.    Dark urine.    Decreasing urine frequency and amount.    Confusion.    Rapid breathing or pulse.  SEEK IMMEDIATE MEDICAL CARE IF:   · You have blood or brown flecks (like coffee grounds) in your vomit.  · You have black or bloody stools.  · You have a severe headache or stiff neck.  · You are confused.  · You have severe abdominal pain.  · You have chest pain or trouble breathing.  · You do not urinate at least once every 8 hours.  · You develop cold or clammy skin.  · You continue to vomit for longer than 24 to 48 hours.  · You have a fever.  MAKE SURE YOU:   · Understand these instructions.  · Will watch your condition.  · Will get help right away if you are not doing well or get worse.     This information is not intended to replace advice given to you by your health care provider. Make sure you discuss any questions you have with your health care provider.     Document Released: 08/14/2005 Document Revised: 11/06/2011 Document Reviewed: 01/11/2011  Elsevier Interactive Patient Education ©2016 Elsevier Inc.

## 2017-01-08 LAB — AMB REFERRAL TO OB-GYN: Pap: NEGATIVE

## 2018-03-29 ENCOUNTER — Other Ambulatory Visit: Payer: Self-pay

## 2018-03-29 ENCOUNTER — Ambulatory Visit: Payer: BLUE CROSS/BLUE SHIELD | Admitting: Podiatry

## 2018-03-29 ENCOUNTER — Encounter: Payer: Self-pay | Admitting: Podiatry

## 2018-03-29 ENCOUNTER — Telehealth: Payer: Self-pay | Admitting: *Deleted

## 2018-03-29 ENCOUNTER — Ambulatory Visit (INDEPENDENT_AMBULATORY_CARE_PROVIDER_SITE_OTHER): Payer: BLUE CROSS/BLUE SHIELD

## 2018-03-29 DIAGNOSIS — L97519 Non-pressure chronic ulcer of other part of right foot with unspecified severity: Secondary | ICD-10-CM

## 2018-03-29 DIAGNOSIS — E11621 Type 2 diabetes mellitus with foot ulcer: Secondary | ICD-10-CM | POA: Diagnosis not present

## 2018-03-29 DIAGNOSIS — S90821A Blister (nonthermal), right foot, initial encounter: Secondary | ICD-10-CM

## 2018-03-29 MED ORDER — DOXYCYCLINE HYCLATE 100 MG PO CAPS: 100.0000 mg | ORAL_CAPSULE | Freq: Two times a day (BID) | ORAL | 0 refills | Status: AC

## 2018-03-29 NOTE — Telephone Encounter (Signed)
Dr. Eloy EndGalaway ordered calcium alginate with silver dressing, 2 x 2 gauze, gloves and saline for right hallux wound measuring 0.3 x 0.4 x 0.1cm E11.621, L97.519 for daily dressing changes, faxed to [facility].

## 2018-03-31 NOTE — Progress Notes (Signed)
Subjective: Yvette Patel is a 42 yo AAF who presents today with diabetes with cc of formation of "blister". Patient states she had gone on vacation and worn flip flops about 2-3 weeks ago. When she returned home, one day she was walking barefoot, her son noticed blood on the floor. At that point, she thought it was a blister.  Objective: Vascular Examination: Capillary refill time <3 seconds x 10 digits Dorsalis pedis and Posterior tibial pulses present b/l No digital hair x 10 digits Skin temperature gradient WNL b/l  Dermatological Examination: Skin with normal turgor, texture and tone b/l Toenails 1-5 b/l adequate length  Ulceration noted plantar aspect right hallux at interphalangeal joint. Hyperkeratotic periphery and pinpoint center. No flocculence, no edema, no erythema. Predebridement: 2.0 x 2.5 cm. Postdebridement: 0.3 x 0.4  X 0.1 cm with pinpoint granular center. No odor. No tracking, no tunneling nor undermining.  Musculoskeletal: Muscle strength 5/5 to all LE muscle groups  Neurological: Sensation diminished with 10 gram monofilament. Vibratory sensation diminished  Xrays: show no evidence of bone erosion. No gas in tissues.  Assessment: 1. 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 NIDDM with Diabetic neuropathy  Plan: Discussed ulceration and need for debridement in order to heal. Patient agreed. Ulceration cleansed with saline. Ulceration debrided. Silver gel and dressing applied to digit.  Form filled out for [facility] for saline, gloves, sterile 2 x 2's and Collagen with Silver. She is to keep right foot dry. She is to change dressing once daily, take her antibiotics as prescribed and limit weightbearing. 2. Darco shoe dispensed.  3. Xrays taken and reviewed today. 4. Rx sent for doxycycline 100 mg po bid x 10 days 5. Follow up 1 week.

## 2018-04-01 ENCOUNTER — Telehealth: Payer: Self-pay | Admitting: Podiatry

## 2018-04-01 NOTE — Telephone Encounter (Signed)
Reviewed faxed [facility] order of 03/29/2018, and the demographics had been faxed with the order, refaxed to [facility].

## 2018-04-01 NOTE — Telephone Encounter (Signed)
This is [facility] calling about an order we received on Yvette Patel. We are missing demographic information. We tried reaching out to the pt but was unable to get in touch with them. If you could fax that to us at [phone]. Thank you.

## 2018-04-05 ENCOUNTER — Encounter: Payer: Self-pay | Admitting: Podiatry

## 2018-04-05 ENCOUNTER — Ambulatory Visit: Payer: BLUE CROSS/BLUE SHIELD | Admitting: Podiatry

## 2018-04-05 DIAGNOSIS — E11621 Type 2 diabetes mellitus with foot ulcer: Secondary | ICD-10-CM

## 2018-04-05 DIAGNOSIS — L97519 Non-pressure chronic ulcer of other part of right foot with unspecified severity: Secondary | ICD-10-CM

## 2018-04-09 NOTE — Progress Notes (Signed)
Subjective: Yvette Patel is a 42 yo AAFwho presents today for followup of diabetic ulcer right great toe.  Patient states she did receive her wound care products and is still taking her antibiotics and has a few pills left. She has been keeping her foot dry,  wound clean and dressing it everyday.  Objective: Vascular Examination: Capillary refill time <3 seconds x 10 digits Dorsalis pedis and Posterior tibial pulses present b/l No digital hair x 10 digits Skin temperature gradient WNL b/l  Dermatological Examination: Skin with normal turgor, texture and tone b/l Toenails 1-5 b/l adequate length  Area of ulceration noted plantar aspect right hallux at interphalangeal joint. Hyperkeratotic periphery and pinpoint center. No flocculence, no edema, no erythema. Area has now progressed to complete epithelialization with no flocculence, no erythema, no edema, no drainage.  Areas of irritation dorsal aspect 1st webspace from flip flops. No erthema, no edema, no drainage.   Musculoskeletal: Muscle strength 5/5 to all LE muscle groups  Neurological: Sensation diminished with 10 gram monofilament. Vibratory sensation diminished  Xrays 03/29/18: show no evidence of bone erosion. No gas in tissues.  Assessment: 1. 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 NIDDM with Diabetic neuropathy  Plan: 1. Areas of irritation cleaned and antibiotic ointment applied.  She is to apply antibiotic ointment to dorsal areas of irritation from flip flops once daily. 2. She may discontinue Darco surgical shoe and wear regular shoe gear. I recommended [employer]chers. She related understanding. Will check her benefits for diabetic shoes. 3. She will need routine follow up for at-risk foot care 4. Follow up 3 months for diabetic foot care. 5. [name] is to call the office should she have any questions/concerns in the interim.

## 2018-06-14 ENCOUNTER — Ambulatory Visit: Payer: BLUE CROSS/BLUE SHIELD | Admitting: Podiatry

## 2018-07-12 LAB — HEMOGLOBIN EVAL RFX ELECTROPHORESIS: Hemoglobin Evaluation: NORMAL

## 2018-07-12 LAB — OB RESULTS CONSOLE RUBELLA ANTIBODY, IGM: Rubella: IMMUNE

## 2018-07-12 LAB — AMB REFERRAL TO OB-GYN: Urine Culture, OB: NEGATIVE

## 2018-07-12 LAB — OB RESULTS CONSOLE ABO/RH: RH Type: POSITIVE

## 2018-07-12 LAB — OB RESULTS CONSOLE RPR: RPR: NONREACTIVE

## 2018-07-12 LAB — OB RESULTS CONSOLE VARICELLA ZOSTER ANTIBODY, IGG: Varicella: IMMUNE

## 2018-07-12 LAB — OB RESULTS CONSOLE HGB/HCT, BLOOD
HCT: 30 (ref 29–41)
Hemoglobin: 10.1

## 2018-07-12 LAB — OB RESULTS CONSOLE PLATELET COUNT: Platelets: 164

## 2018-07-12 LAB — OB RESULTS CONSOLE ANTIBODY SCREEN: Antibody Screen: NEGATIVE

## 2018-07-12 LAB — OB RESULTS CONSOLE HIV ANTIBODY (ROUTINE TESTING): HIV: NONREACTIVE

## 2018-07-12 LAB — OB RESULTS CONSOLE HEPATITIS B SURFACE ANTIGEN: Hepatitis B Surface Ag: NEGATIVE

## 2018-07-30 ENCOUNTER — Other Ambulatory Visit: Payer: Self-pay | Admitting: Obstetrics and Gynecology

## 2018-08-07 DIAGNOSIS — E11319 Type 2 diabetes mellitus with unspecified diabetic retinopathy without macular edema: Secondary | ICD-10-CM | POA: Insufficient documentation

## 2018-08-07 DIAGNOSIS — H4311 Vitreous hemorrhage, right eye: Secondary | ICD-10-CM

## 2018-08-07 DIAGNOSIS — E1139 Type 2 diabetes mellitus with other diabetic ophthalmic complication: Secondary | ICD-10-CM

## 2018-08-07 DIAGNOSIS — E113593 Type 2 diabetes mellitus with proliferative diabetic retinopathy without macular edema, bilateral: Secondary | ICD-10-CM

## 2018-08-22 ENCOUNTER — Other Ambulatory Visit: Payer: Self-pay

## 2018-08-26 ENCOUNTER — Encounter: Payer: No Typology Code available for payment source | Admitting: Family Medicine

## 2018-09-23 ENCOUNTER — Encounter: Payer: Self-pay | Admitting: Obstetrics & Gynecology

## 2018-09-23 ENCOUNTER — Ambulatory Visit (INDEPENDENT_AMBULATORY_CARE_PROVIDER_SITE_OTHER): Payer: Self-pay | Admitting: Obstetrics & Gynecology

## 2018-09-23 VITALS — BP 136/69 | HR 105 | Wt 222.9 lb

## 2018-09-23 DIAGNOSIS — O24312 Unspecified pre-existing diabetes mellitus in pregnancy, second trimester: Secondary | ICD-10-CM

## 2018-09-23 DIAGNOSIS — Z113 Encounter for screening for infections with a predominantly sexual mode of transmission: Secondary | ICD-10-CM

## 2018-09-23 DIAGNOSIS — O0990 Supervision of high risk pregnancy, unspecified, unspecified trimester: Secondary | ICD-10-CM

## 2018-09-23 DIAGNOSIS — O24919 Unspecified diabetes mellitus in pregnancy, unspecified trimester: Secondary | ICD-10-CM | POA: Insufficient documentation

## 2018-09-23 DIAGNOSIS — O34219 Maternal care for unspecified type scar from previous cesarean delivery: Secondary | ICD-10-CM | POA: Insufficient documentation

## 2018-09-23 DIAGNOSIS — Z8751 Personal history of pre-term labor: Secondary | ICD-10-CM | POA: Insufficient documentation

## 2018-09-23 DIAGNOSIS — O0992 Supervision of high risk pregnancy, unspecified, second trimester: Secondary | ICD-10-CM

## 2018-09-23 DIAGNOSIS — O24419 Gestational diabetes mellitus in pregnancy, unspecified control: Secondary | ICD-10-CM

## 2018-09-23 DIAGNOSIS — O24319 Unspecified pre-existing diabetes mellitus in pregnancy, unspecified trimester: Secondary | ICD-10-CM

## 2018-09-23 LAB — POCT URINALYSIS DIP (DEVICE)
Bilirubin Urine: NEGATIVE
Glucose, UA: NEGATIVE mg/dL
Hgb urine dipstick: NEGATIVE
Ketones, ur: NEGATIVE mg/dL
Leukocytes, UA: NEGATIVE
Nitrite: NEGATIVE
Protein, ur: 100 mg/dL — AB
Specific Gravity, Urine: 1.01 (ref 1.005–1.030)
Urobilinogen, UA: 1 mg/dL (ref 0.0–1.0)
pH: 5.5 (ref 5.0–8.0)

## 2018-09-23 MED ORDER — HYDROXYPROGESTERONE CAPROATE 275 MG/1.1ML ~~LOC~~ SOAJ: 275.0000 mg | Freq: Once | SUBCUTANEOUS | Status: DC

## 2018-09-23 MED ORDER — ASPIRIN EC 81 MG PO TBEC: 81.0000 mg | DELAYED_RELEASE_TABLET | Freq: Every day | ORAL | 2 refills | Status: AC

## 2018-09-23 NOTE — Progress Notes (Signed)
Here for new ob visit. Given new patient packet. Declines flu shot. Declines to see genetic counselor. [place] scheduled. States she had a genetic test done already. No genetic results in records. Called [facility] and they report she had a Panorama drawn and they will send those results.

## 2018-09-23 NOTE — Progress Notes (Signed)
Subjective:   Yvette Patel is a 43 y.o. [id number] at 928w5d by LMP being seen today for her first obstetrical visit following referral from her primary OBGYN provider due high risk pregnancy. This was an unexpected pregnancy and her obstetrical history is significant for advanced maternal age, type 2 diabetes mellitus controlled on insulin and metformin. Patient does intend to breast feed. Pregnancy history fully reviewed.  Patient states that her blood glucose reading have been mostly within recommended limits with occasional elevations. She reports no hypoglycemic episodes.  Patient reports several presyncopal episodes after her Procardia dose was changed from 30mg  qd to 60 mg qd 3 weeks ago. She has since altered the dose to 30mg  BID and has not experienced anymore episodes since.   Patient reports backache, no bleeding, no contractions and no leaking.   HISTORY: OB History  Gravida Para Term Preterm AB Living  5 3 0 3 1 3   SAB TAB Ectopic Multiple Live Births  0 1 0 0 3    # Outcome Date GA Lbr Len/2nd Weight Sex Delivery Anes PTL Lv  5 Current           4 TAB 2012          3 Preterm 2007 267w0d  5 lb 9 oz (2.523 kg) M CS-Unspec EPI  LIV     Birth Comments: repeat c/s  2 Preterm 2003 6267w0d  5 lb 8 oz (2.495 kg) M CS-Unspec EPI  LIV     Birth Comments: emergency c/s  1 Preterm 2001 8161w0d  5 lb 9 oz (2.523 kg) M Vag-Spont None  LIV     Birth Comments: GDM    Last pap smear was done 2018 and was normal  Past Medical History:  Diagnosis Date  . Diabetes mellitus without complication (HCC)   . Diabetic retinopathy (HCC)   . Diabetic retinopathy (HCC)   . Hypertension   . 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 CESAREAN SECTION    . DILATION AND CURETTAGE OF UTERUS     Family History  Problem Relation Age of Onset  . Heart disease Mother   . Kidney disease Mother   . Diabetes Mother   .  Diabetes Father   . Hypertension Father   . Heart disease Father   . Diabetes Sister   . Cancer Sister    Social History   Tobacco Use  . Smoking status: Former Smoker    Packs/day: 0.00    Years: 0.00    Pack years: 0.00  . Smokeless tobacco: Never Used  Substance Use Topics  . Alcohol use: No  . Drug use: No   Allergies  Allergen Reactions  . Chlorine Hives and Other (See Comments)    Skin peels  . Mushroom Ext Cmplx(Shiitake-Reishi-Mait) Nausea And Vomiting  . Sulfa Antibiotics Other (See Comments)    Turned eyes blood shot and teared blood  . Citric Acid Rash and Other (See Comments)    Skin peels  . Penicillins Itching and Nausea And Vomiting   Current Outpatient Medications on File Prior to Visit  Medication Sig Dispense Refill  . glucose blood (CONTOUR NEXT TEST) test strip USE TO CHECK BLOOD GLUCOSE BID.    Marland Kitchen. Insulin Detemir (LEVEMIR FLEXTOUCH) 100 UNIT/ML Pen Inject 14 Units into the skin at bedtime.    . Insulin Pen Needle (BD PEN NEEDLE NANO U/F) 32G X  4 MM MISC Use to inject insulin nightly    . LEVEMIR FLEXTOUCH 100 UNIT/ML Pen INJ 10 UNITS [place] NIGHTLY    . metFORMIN (GLUCOPHAGE-XR) 500 MG 24 hr tablet TK 1 T PO BID  1  . NIFEdipine (PROCARDIA-XL/ADALAT-CC/NIFEDICAL-XL) 30 MG 24 hr tablet TK 1 T PO D  3  . Prenatal Vit-Fe Fumarate-FA (PRENATAL VITAMIN PO) Take 1 tablet by mouth daily.     . B Complex Vitamins (VITAMIN B COMPLEX PO) Take by mouth.    . ciprofloxacin (CIPRO) 250 MG tablet TK 1 T PO BID  0  . DOCOSAHEXAENOIC ACID PO TAKE ONE TABLET BY MOUTH DAILY. "I don't take it everyday."    . JANUVIA 100 MG tablet TK 1 T PO D  2  . ondansetron (ZOFRAN ODT) 8 MG disintegrating tablet Take 1 tablet (8 mg total) by mouth every 8 (eight) hours as needed for nausea or vomiting. 20 tablet 0  . Prenat-FeFum-FePo-FA-Omega 3 (CONCEPT DHA) 53.5-38-1 MG CAPS TK ONE C PO D  0  . sulfamethoxazole-trimethoprim (BACTRIM DS,SEPTRA DS) 800-160 MG tablet TK 1 T PO BID  0   No  current facility-administered medications on file prior to visit.     Review of Systems Pertinent items noted in HPI and remainder of comprehensive ROS otherwise negative.  Exam   Vitals:   09/23/18 1339  BP: 136/69  Pulse: (!) 105  Weight: 222 lb 14.4 oz (101.1 kg)   Fetal Heart Rate (bpm): 150  Uterus:     Pelvic Exam: Perineum: no hemorrhoids, normal perineum   Vulva: normal external genitalia, no lesions   Vagina:  normal mucosa, normal discharge   Cervix: no lesions and normal, pap smear deferred (last was 2018).    Adnexa: normal adnexa and no mass, fullness, tenderness   Bony Pelvis: average  System: General: well-developed, well-nourished female in no acute distress   Breasts:  normal appearance, no masses or tenderness bilaterally   Skin: normal coloration and turgor, no rashes   Neurologic: oriented, normal, negative, normal mood   Extremities: normal strength, tone, and muscle mass, ROM of all joints is normal   HEENT PERRLA, extraocular movement intact and sclera clear, anicteric   Mouth/Teeth mucous membranes moist, pharynx normal without lesions. Has several missing teeth and caries.   Neck supple and no masses   Cardiovascular: regular rate and rhythm   Respiratory:  no respiratory distress, normal breath sounds   Abdomen: soft, non-tender; bowel sounds normal; no masses,  no organomegaly   Bedside Ultrasound for FHR check: 150 bpm Patient informed that the ultrasound is considered a limited obstetric ultrasound and is not intended to be a complete ultrasound exam.  Patient also informed that the ultrasound is not being completed with the intent of assessing for fetal or placental anomalies or any pelvic abnormalities.  Explained that the purpose of today's ultrasound is to assess for fetal heart rate.  Patient acknowledges the purpose of the exam and the limitations of the study.   Assessment:   Pregnancy: [id number] Patient Active Problem List   Diagnosis Date  Noted  . Supervision of high risk pregnancy, antepartum 09/23/2018  . Previous cesarean delivery, antepartum 09/23/2018  . History of preterm delivery 09/23/2018  . Diabetes mellitus complicating pregnancy 09/23/2018  . Diabetic retinopathy (HCC)   . 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 Supervision of  high risk pregnancy, antepartum - FHT and FH normal -  Discussed starting Makena injection q week for prevention of preterm labor. - CMP and UPC ordered d/t pre-existing diabetes to monitor kidney function as well as risk for developing pre-eclampsia. - Patient expressed interest in TOLAC (had 2 previous CS). Discussed with patient that this may not be possible. Patient conveyed understanding. - Desires postpartum contraception. Has been on OCPs in the past but reports that she commonly forgot to take. Would like to do more research at home on different forms. States that she is not interested in BTL. - Planned MOF: breastfeeding  - [place] MFM OB DETAIL +14 WK; Future - [facility] AM[facility] BABYSCRIPTS OPT IN  2. Pre-existing diabetes mellitus during pregnancy, antepartum - Blood glucose is controlled on Metformin and Levemir. Patient counseled to continue limiting carb intake and tracking blood sugar at home.  - Start ASA 81 mg  - [place] MFM OB DETAIL +14 WK; Future - [facility] BABYSCRIPTS OPT IN   Initial labs drawn. Continue prenatal vitamins. Genetic Screening discussed, First trimester screen, Quad screen and NIPS: results reviewed. Ultrasound discussed; fetal anatomic survey: ordered. Problem list reviewed and updated. The nature of [facility] with multiple MDs and other Advanced Practice Providers was explained to patient; also emphasized that residents, students are part of our team. Routine obstetric precautions reviewed. Return in about 2 weeks (around 10/07/2018) for asap if she wants Makena.    Caliana Spires,  Student-PA  09/23/2018, 3:31 PM

## 2018-09-23 NOTE — Patient Instructions (Addendum)
Childbirth Education Options: [facility] Department Classes:  Childbirth education classes can help you get ready for a positive parenting experience. You can also meet other expectant parents and get free stuff for your baby. Each class runs for five weeks on the same night and costs $45 for the mother-to-be and her support person. Medicaid covers the cost if you are eligible. Call [phone] to register. [facility] Childbirth Education:  [phone] or [phone] or [email or web address]  Baby & Me Class: Discuss newborn & infant parenting and family adjustment issues with other new mothers in a relaxed environment. Each week brings a new speaker or baby-centered activity. We encourage new mothers to join [place] every Thursday at 11:00am. Babies birth until crawling. No registration or fee. Daddy [employer]: This course offers Dads-to-be the tools and knowledge needed to feel confident on their journey to becoming new fathers. Experienced dads, who have been trained as coaches, teach dads-to-be how to hold, comfort, diaper, swaddle and play with their infant while being able to support the new mom as well. A class for men taught by men. $25/dad Big Brother/Big Sister: Let your children share in the Airen of a new brother or sister in this special class designed just for them. Class includes discussion about how families care for babies: swaddling, holding, diapering, safety as well as how they can be helpful in their new role. This class is designed for children ages 45 to 48, but any age is welcome. Please register each child individually. $5/child  Mom Talk: This mom-led group offers support and connection to mothers as they journey through the adjustments and struggles of that sometimes overwhelming first year after the birth of a child. Tuesdays at 10:00am and Thursdays at 6:00pm. Babies welcome. No registration or fee. Breastfeeding Support Group: This group is a mother-to-mother  support circle where moms have the opportunity to share their breastfeeding experiences. A Lactation Consultant is present for questions and concerns. Meets each Tuesday at 11:00am. No fee or registration. Breastfeeding Your Baby: Learn what to expect in the first days of breastfeeding your newborn.  This class will help you feel more confident with the skills needed to begin your breastfeeding experience. Many new mothers are concerned about breastfeeding after leaving the hospital. This class will also address the most common fears and challenges about breastfeeding during the first few weeks, months and beyond. (call for fee) Comfort Techniques and Tour: This 2 hour interactive class will provide you the opportunity to learn & practice hands-on techniques that can help relieve some of the discomfort of labor and encourage your baby to rotate toward the best position for birth. You and your partner will be able to try a variety of labor positions with birth balls and rebozos as well as practice breathing, relaxation, and visualization techniques. A tour of the [facility] is included with this class. $20 per registrant and support person Childbirth Class- Weekend Option: This class is a Weekend version of our Birth & Baby series. It is designed for parents who have a difficult time fitting several weeks of classes into their schedule. It covers the care of your newborn and the basics of labor and childbirth. It also includes a [place] of [facility] and lunch. The class is held two consecutive days: beginning on Friday evening from 6:30 - 8:30 p.m. and the next day, Saturday from 9 a.m. - 4 p.m. (call for fee) Doren Custard Class: Interested in a waterbirth?  This  informational class will help you discover whether waterbirth is the right fit for you. Education about waterbirth itself, supplies you would need and how to assemble your support team is what you can  expect from this class. Some obstetrical practices require this class in order to pursue a waterbirth. (Not all obstetrical practices offer waterbirth-check with your healthcare provider.) Register only the expectant mom, but you are encouraged to bring your partner to class! Required if planning waterbirth, no fee. Infant/Child CPR: Parents, grandparents, babysitters, and friends learn Cardio-Pulmonary Resuscitation skills for infants and children. You will also learn how to treat both conscious and unconscious choking in infants and children. This Family & Friends program does not offer certification. Register each participant individually to ensure that enough mannequins are available. (Call for fee) Grandparent Love: Expecting a grandbaby? This class is for you! Learn about the latest infant care and safety recommendations and ways to support your own child as he or she transitions into the parenting role. Taught by Registered Nurses who are childbirth instructors, but most importantly...they are grandmothers too! $10/person. Childbirth Class- Natural Childbirth: This series of 5 weekly classes is for expectant parents who want to learn and practice natural methods of coping with the process of labor and childbirth. Relaxation, breathing, massage, visualization, role of the partner, and helpful positioning are highlighted. Participants learn how to be confident in their body's ability to give birth. This class will empower and help parents make informed decisions about their own care. Includes discussion that will help new parents transition into the immediate postpartum period. Maternity Care Center Tour of [facility] is included. We suggest taking this class between 25-32 weeks, but it's only a recommendation. $75 per registrant and one support person or $30 Medicaid. Childbirth Class- 3 week Series: This option of 3 weekly classes helps you and your labor partner prepare for childbirth. Newborn  care, labor & birth, cesarean birth, pain management, and comfort techniques are discussed and a Maternity Care Center Tour of [facility] is included. The class meets at the same time, on the same day of the week for 3 consecutive weeks beginning with the starting date you choose. $60 for registrant and one support person.  Marvelous Multiples: Expecting twins, triplets, or more? This class covers the differences in labor, birth, parenting, and breastfeeding issues that face multiples' parents. NICU tour is included. Led by a Certified Childbirth Educator who is the mother of twins. No fee. Caring for Baby: This class is for expectant and adoptive parents who want to learn and practice the most up-to-date newborn care for their babies. Focus is on birth through the first six weeks of life. Topics include feeding, bathing, diapering, crying, umbilical cord care, circumcision care and safe sleep. Parents learn to recognize symptoms of illness and when to call the pediatrician. Register only the mom-to-be and your partner or support person can plan to come with you! $10 per registrant and support person Childbirth Class- online option: This online class offers you the freedom to complete a Birth and Baby series in the comfort of your own home. The flexibility of this option allows you to review sections at your own pace, at times convenient to you and your support people. It includes additional video information, animations, quizzes, and extended activities. Get organized with helpful eClass tools, checklists, and trackers. Once you register online for the class, you will receive an email within a few days to accept the invitation and begin the class when the time   is right for you. The content will be available to you for 60 days. $60 for 60 days of online access for you and your support people.  Local Doulas: Natural Baby Doulas [email or web address] Tel:  [phone] [email or web address] [employer] [phone] [email or web address] The Labor Hassell Halim  (also do waterbirth tub rental) [phone] [email or web address] Triad Birth Doula [phone] [email or web address] NotebookDistributors.fi Sacred Rhythms  [phone] https://[email or web address]-[email or web address]/ [employer] (PADA) [email or web address]/ [place] Birth and Baby  [email or web address]/ Considering Waterbirth? Guide for patients at [facility] [employer]  Why consider waterbirth?  . Gentle birth for babies . Less pain medicine used in labor . May allow for passive descent/less pushing . May reduce perineal tears  . More mobility and instinctive maternal position changes . Increased maternal relaxation . Reduced blood pressure in labor  Is waterbirth safe? What are the risks of infection, drowning or other complications?  . Infection: o Very low risk (3.7 % for tub vs 4.8% for bed) o 7 in 8000 waterbirths with documented infection o Poorly cleaned equipment most common cause o Slightly lower group B strep transmission rate  . Drowning o Maternal:  - Very low risk   - Related to seizures or fainting o Newborn:  - Very low risk. No evidence of increased risk of respiratory problems in multiple large studies - Physiological protection from breathing under water - Avoid underwater birth if there are any fetal complications - Once baby's head is out of the water, keep it out.  . Birth complication o Some reports of cord trauma, but risk decreased by bringing baby to surface gradually o No evidence of increased risk of shoulder dystocia. Mothers can usually change positions faster in water than in a bed, possibly aiding the maneuvers to free the shoulder.   You must attend a Doren Custard class at [facility]  3rd Wednesday of every month from 7-9pm  [employer] by calling [phone] or online at [email or web address]  Bring [place] the certificate from the class to your prenatal appointment  Meet with a midwife at 36 weeks to see if you can still plan a waterbirth and to sign the consent.   Purchase or rent the following supplies:   Water Birth Pool (Birth Pool in a Box or [place] for instance)  (Tubs start ~$125)  Single-use disposable tub liner designed for your brand of tub  New garden hose labeled "lead-free", "suitable for drinking water",  Electric drain pump to remove water (We recommend 792 gallon per hour or greater pump.)   Separate garden hose to remove the dirty water  Fish net  Bathing suit top (optional)  Long-handled mirror (optional)  Places to purchase or rent supplies  GotWebTools.is for tub purchases and supplies  Waterbirthsolutions.com for tub purchases and supplies  The Labor Ladies (www.[email or web address]) $275 for tub rental/set-up & take down/kit   [employer] (http://www.fleming.com/.htm) Information regarding doulas (labor support) who provide pool rentals  Our practice has a Birth Pool in a Box tub at the hospital that you may borrow on a first-come-first-served basis. It is your responsibility to to set up, clean and break down the tub. We cannot guarantee the availability of this tub in advance. You are responsible for bringing all accessories listed above. If you do not have all necessary supplies you cannot have a waterbirth.    Things that would prevent you from having a waterbirth:  Premature, <37wks  Previous cesarean birth  Presence of thick meconium-stained fluid  Multiple gestation (Twins,  triplets, etc.)  Uncontrolled diabetes or gestational diabetes requiring medication  Hypertension requiring medication or diagnosis of pre-eclampsia  Heavy vaginal bleeding  Non-reassuring fetal  heart rate  Active infection (MRSA, etc.). Group B Strep is NOT a contraindication for  waterbirth.  If your labor has to be induced and induction method requires continuous  monitoring of the baby's heart rate  Other risks/issues identified by your obstetrical provider  Please remember that birth is unpredictable. Under certain unforeseeable circumstances your provider may advise against giving birth in the tub. These decisions will be made on a case-by-case basis and with the safety of you and your baby as our highest priority.    Hydroxyprogesterone caproate injection for pregnancy What is this medicine? HYDROXYPROGESTERONE (hye drox ee proe JES ter one) is a female hormone. This medicine is used in women who are pregnant and who have delivered a baby too early (preterm) in the past. It helps lower the risk of having a preterm baby again. This medicine may be used for other purposes; ask your health care provider or pharmacist if you have questions. COMMON BRAND NAME(S): Makena What should I tell my health care provider before I take this medicine? They need to know if you have any of these conditions: -breast, cervical, uterine, or vaginal cancer -depression -diabetes or prediabetes -heart disease -high blood pressure -history of blood clots -kidney disease -liver disease -lung or breathing disease, like asthma -migraine headaches -seizures -vaginal bleeding -an unusual or allergic reaction to hydroxyprogesterone, other hormones, castor oil, benzyl alcohol, other medicines, foods, dyes, or preservatives -breast-feeding How should I use this medicine? This medicine is for injection into a muscle or under the skin. You will receive an injection once every week (every 7 days) as directed during your pregnancy. It is given by a health care professional in a hospital or clinic setting. Talk to your pediatrician regarding the use of this medicine in children. While this drug may be  prescribed for pregnant women as young as 16 years, precautions do apply. Overdosage: If you think you have taken too much of this medicine contact a poison control center or emergency room at once. NOTE: This medicine is only for you. Do not share this medicine with others. What if I miss a dose? It is important not to miss your dose. Call your doctor or health care professional if you are unable to keep an appointment. What may interact with this medicine? Significant interactions are not expected. This list may not describe all possible interactions. Give your health care provider a list of all the medicines, herbs, non-prescription drugs, or dietary supplements you use. Also tell them if you smoke, drink alcohol, or use illegal drugs. Some items may interact with your medicine. What should I watch for while using this medicine? Your pregnancy will be monitored carefully while you are receiving this medicine. What side effects may I notice from receiving this medicine? Side effects that you should report to your doctor or health care professional as soon as possible: -allergic reactions like skin rash, itching or hives, swelling of the face, lips, or tongue -breathing problems -depressed mood -increase in blood pressure -increased hunger or thirst -increased urination -signs and symptoms of a blood clot such as breathing problems; changes in vision; chest pain; severe, sudden headache; pain, swelling, warmth in the leg; trouble speaking; sudden numbness or weakness of the face, arm or leg -unusually weak or tired -unusual vaginal bleeding -yellowing of the eyes or skin Side effects  that usually do not require medical attention (report to your doctor or health care professional if they continue or are bothersome): -diarrhea -fluid retention and swelling -nausea -pain, redness, or irritation at site where injected This list may not describe all possible side effects. Call your doctor for  medical advice about side effects. You may report side effects to FDA at 1-800-FDA-1088. Where should I keep my medicine? This drug is given in a hospital or clinic and will not be stored at home. NOTE: This sheet is a summary. It may not cover all possible information. If you have questions about this medicine, talk to your doctor, pharmacist, or health care provider.  2019 Elsevier/Gold Standard (2017-04-29 11:14:47)

## 2018-09-23 NOTE — Progress Notes (Signed)
Makena form filled out an faxed.

## 2018-09-23 NOTE — Progress Notes (Signed)
Called Dr.Cotton's office to schedule fetal echo. They asked we send referral form and they will call her with an appointment. Will send form.

## 2018-09-24 LAB — GC/CHLAMYDIA PROBE AMP (~~LOC~~) NOT AT ARMC
Chlamydia: NEGATIVE
Neisseria Gonorrhea: NEGATIVE

## 2018-09-24 LAB — COMPREHENSIVE METABOLIC PANEL
ALT: 12 IU/L (ref 0–32)
AST: 12 IU/L (ref 0–40)
Albumin/Globulin Ratio: 1.5 (ref 1.2–2.2)
Albumin: 3.8 g/dL (ref 3.8–4.8)
Alkaline Phosphatase: 39 IU/L (ref 39–117)
BUN/Creatinine Ratio: 17 (ref 9–23)
BUN: 12 mg/dL (ref 6–24)
Bilirubin Total: 0.3 mg/dL (ref 0.0–1.2)
CO2: 20 mmol/L (ref 20–29)
Calcium: 8.7 mg/dL (ref 8.7–10.2)
Chloride: 103 mmol/L (ref 96–106)
Creatinine, Ser: 0.69 mg/dL (ref 0.57–1.00)
GFR calc Af Amer: 125 mL/min/{1.73_m2} (ref >59)
GFR calc non Af Amer: 108 mL/min/{1.73_m2} (ref >59)
Globulin, Total: 2.5 g/dL (ref 1.5–4.5)
Glucose: 77 mg/dL (ref 65–99)
Potassium: 4 mmol/L (ref 3.5–5.2)
SODIUM: 136 mmol/L (ref 134–144)
Total Protein: 6.3 g/dL (ref 6.0–8.5)

## 2018-09-24 LAB — PROTEIN / CREATININE RATIO, URINE
CREATININE, UR: 152.8 mg/dL
Protein, Ur: 54.6 mg/dL
Protein/Creat Ratio: 357 mg/g creat — ABNORMAL HIGH (ref 0–200)

## 2018-09-25 ENCOUNTER — Encounter (HOSPITAL_COMMUNITY): Payer: Self-pay

## 2018-09-25 ENCOUNTER — Ambulatory Visit (HOSPITAL_COMMUNITY)
Admission: RE | Admit: 2018-09-25 | Discharge: 2018-09-25 | Disposition: A | Discharge status: Home / Self Care | Payer: Self-pay | Source: Ambulatory Visit | Attending: Obstetrics & Gynecology | Admitting: Obstetrics & Gynecology

## 2018-09-25 ENCOUNTER — Ambulatory Visit (HOSPITAL_COMMUNITY): Admission: RE | Admit: 2018-09-25 | Payer: Self-pay | Source: Ambulatory Visit

## 2018-09-25 DIAGNOSIS — O09212 Supervision of pregnancy with history of pre-term labor, second trimester: Secondary | ICD-10-CM

## 2018-09-25 DIAGNOSIS — Z3A20 20 weeks gestation of pregnancy: Secondary | ICD-10-CM

## 2018-09-25 DIAGNOSIS — O09522 Supervision of elderly multigravida, second trimester: Secondary | ICD-10-CM

## 2018-09-25 DIAGNOSIS — O0990 Supervision of high risk pregnancy, unspecified, unspecified trimester: Secondary | ICD-10-CM | POA: Insufficient documentation

## 2018-09-25 DIAGNOSIS — O10012 Pre-existing essential hypertension complicating pregnancy, second trimester: Secondary | ICD-10-CM

## 2018-09-25 DIAGNOSIS — O24319 Unspecified pre-existing diabetes mellitus in pregnancy, unspecified trimester: Secondary | ICD-10-CM | POA: Insufficient documentation

## 2018-09-25 DIAGNOSIS — O24112 Pre-existing diabetes mellitus, type 2, in pregnancy, second trimester: Secondary | ICD-10-CM

## 2018-09-25 DIAGNOSIS — Z363 Encounter for antenatal screening for malformations: Secondary | ICD-10-CM

## 2018-09-26 ENCOUNTER — Other Ambulatory Visit (HOSPITAL_COMMUNITY): Payer: Self-pay | Admitting: *Deleted

## 2018-09-26 DIAGNOSIS — O09522 Supervision of elderly multigravida, second trimester: Secondary | ICD-10-CM

## 2018-09-30 ENCOUNTER — Encounter: Payer: Self-pay | Admitting: *Deleted

## 2018-09-30 ENCOUNTER — Telehealth: Payer: Self-pay | Admitting: General Practice

## 2018-09-30 NOTE — Telephone Encounter (Signed)
Deirlem from [facility] called & left message on nurse voicemail line requesting a call back with updated insurance information for the patient.  Called, no answer- left message with patient's BCBS ID and to call back if there was other questions.

## 2018-10-03 ENCOUNTER — Telehealth: Payer: Self-pay | Admitting: *Deleted

## 2018-10-03 NOTE — Telephone Encounter (Signed)
Received a voicemail from [place] stating  They are calling to see if we have received updated insurance information . States previously patient said would have active insurance 09/28/18.  Request a call back so they can proceed.

## 2018-10-03 NOTE — Telephone Encounter (Signed)
I called Yvette Patel back and gave patient insurance information to the care manager that answered and she states she will forward information to Dierdlam, the care manager for our account.

## 2018-10-07 ENCOUNTER — Ambulatory Visit (INDEPENDENT_AMBULATORY_CARE_PROVIDER_SITE_OTHER): Payer: BLUE CROSS/BLUE SHIELD | Admitting: Obstetrics & Gynecology

## 2018-10-07 ENCOUNTER — Other Ambulatory Visit: Payer: Self-pay

## 2018-10-07 VITALS — BP 144/78 | HR 101 | Wt 223.9 lb

## 2018-10-07 DIAGNOSIS — O0990 Supervision of high risk pregnancy, unspecified, unspecified trimester: Secondary | ICD-10-CM

## 2018-10-07 DIAGNOSIS — O24419 Gestational diabetes mellitus in pregnancy, unspecified control: Secondary | ICD-10-CM

## 2018-10-07 DIAGNOSIS — O0992 Supervision of high risk pregnancy, unspecified, second trimester: Secondary | ICD-10-CM

## 2018-10-07 NOTE — Patient Instructions (Signed)

## 2018-10-07 NOTE — Progress Notes (Signed)
   PRENATAL VISIT NOTE  Subjective:  Yvette Patel is a 43 y.o. [id number] at [redacted]w[redacted]d being seen today for ongoing prenatal care.  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  Patient reports no complaints.  Contractions: Not present. Vag. Bleeding: None.  Movement: Present. Denies leaking of fluid.   The following portions of the patient's history were reviewed and updated as appropriate: allergies, current medications, past family history, past medical history, past social history, past surgical history and problem list. Problem list updated.  Objective:   Vitals:   10/07/18 1642 10/07/18 1650  BP: (!) 146/77 (!) 144/78  Pulse: (!) 101   Weight: 223 lb 14.4 oz (101.6 kg)     Fetal Status: Fetal Heart Rate (bpm): 160   Movement: Present     General:  Alert, oriented and cooperative. Patient is in no acute distress.  Skin: Skin is warm and dry. No rash noted.   Cardiovascular: Normal heart rate noted  Respiratory: Normal respiratory effort, no problems with respiration noted  Abdomen: Soft, gravid, appropriate for gestational age.  Pain/Pressure: Present     Pelvic: Cervical exam deferred        Extremities: Normal range of motion.  Edema: Trace  Mental Status: Normal mood and affect. Normal behavior. Normal judgment and thought content.   Assessment and Plan:  Pregnancy: [id number] at [redacted]w[redacted]d  1. Gestational diabetes mellitus (GDM) affecting pregnancy, antepartum States control is good on current regimen from her endocrinologist  2. Supervision of high risk pregnancy, antepartum Will start Makena once approved  Preterm labor symptoms and general obstetric precautions  including but not limited to vaginal bleeding, contractions, leaking of fluid and fetal movement were reviewed in detail with the patient. Please refer to After Visit Summary for other counseling recommendations.  Return in about 2 weeks (around 10/21/2018) for follow up with udip each visit. .  Future Appointments  Date Time Provider Department Center  10/23/2018  2:00 PM [facility] [place] 3 [facility]US M[facility]-US    Scheryl Darter, MD

## 2018-10-11 ENCOUNTER — Telehealth: Payer: Self-pay | Admitting: *Deleted

## 2018-10-11 NOTE — Telephone Encounter (Signed)
Received a voice message from [facility] that patient has been approved for [facility] thru Patient assistance program and they need [place] to call back to confirm delivery information.   I called Yvette Patel and verified delivery information and we should receive first shipment 10/15/18.  I called patient contact numbers to schedule and was told numbers not in service. Sent message to registrars to attempt to schedule 17p appt for 10/16/18. Patient not on mychart.

## 2018-10-16 ENCOUNTER — Ambulatory Visit: Payer: BLUE CROSS/BLUE SHIELD

## 2018-10-22 ENCOUNTER — Encounter: Payer: BLUE CROSS/BLUE SHIELD | Admitting: Obstetrics & Gynecology

## 2018-10-23 ENCOUNTER — Ambulatory Visit (HOSPITAL_COMMUNITY): Payer: Self-pay

## 2018-10-25 ENCOUNTER — Encounter (HOSPITAL_COMMUNITY): Payer: Self-pay

## 2018-10-25 ENCOUNTER — Ambulatory Visit (HOSPITAL_COMMUNITY): Payer: BLUE CROSS/BLUE SHIELD | Admitting: *Deleted

## 2018-10-25 ENCOUNTER — Other Ambulatory Visit (HOSPITAL_COMMUNITY): Payer: Self-pay | Admitting: *Deleted

## 2018-10-25 ENCOUNTER — Ambulatory Visit (HOSPITAL_COMMUNITY)
Admission: RE | Admit: 2018-10-25 | Discharge: 2018-10-25 | Disposition: A | Discharge status: Home / Self Care | Payer: BLUE CROSS/BLUE SHIELD | Source: Ambulatory Visit | Attending: Obstetrics and Gynecology | Admitting: Obstetrics and Gynecology

## 2018-10-25 VITALS — BP 124/65 | HR 96 | Wt 224.8 lb

## 2018-10-25 DIAGNOSIS — O09212 Supervision of pregnancy with history of pre-term labor, second trimester: Secondary | ICD-10-CM | POA: Diagnosis not present

## 2018-10-25 DIAGNOSIS — Z362 Encounter for other antenatal screening follow-up: Secondary | ICD-10-CM | POA: Diagnosis not present

## 2018-10-25 DIAGNOSIS — Z794 Long term (current) use of insulin: Principal | ICD-10-CM

## 2018-10-25 DIAGNOSIS — O10012 Pre-existing essential hypertension complicating pregnancy, second trimester: Secondary | ICD-10-CM

## 2018-10-25 DIAGNOSIS — Z3A24 24 weeks gestation of pregnancy: Secondary | ICD-10-CM

## 2018-10-25 DIAGNOSIS — O24112 Pre-existing diabetes mellitus, type 2, in pregnancy, second trimester: Secondary | ICD-10-CM

## 2018-10-25 DIAGNOSIS — O24312 Unspecified pre-existing diabetes mellitus in pregnancy, second trimester: Secondary | ICD-10-CM | POA: Diagnosis present

## 2018-10-25 DIAGNOSIS — O09522 Supervision of elderly multigravida, second trimester: Secondary | ICD-10-CM

## 2018-10-25 DIAGNOSIS — IMO0001 Reserved for inherently not codable concepts without codable children: Secondary | ICD-10-CM

## 2018-10-25 DIAGNOSIS — O09529 Supervision of elderly multigravida, unspecified trimester: Secondary | ICD-10-CM

## 2018-10-25 NOTE — Progress Notes (Unsigned)
.  mfm

## 2018-10-28 NOTE — Progress Notes (Signed)
Patient ID: Yvette Patel, female   DOB: 07-28-1976, 43 y.o.   MRN: [id number] I have reviewed the chart and agree with nursing staff's documentation of this patient's encounter.  Scheryl Darter, MD 10/28/2018 11:32 AM

## 2018-10-30 ENCOUNTER — Other Ambulatory Visit: Payer: Self-pay

## 2018-10-30 ENCOUNTER — Ambulatory Visit: Payer: BLUE CROSS/BLUE SHIELD | Admitting: *Deleted

## 2018-10-30 VITALS — BP 130/58 | HR 93

## 2018-10-30 DIAGNOSIS — N390 Urinary tract infection, site not specified: Secondary | ICD-10-CM

## 2018-10-30 MED ORDER — NITROFURANTOIN MONOHYD MACRO 100 MG PO CAPS: 100.0000 mg | ORAL_CAPSULE | Freq: Two times a day (BID) | ORAL | 0 refills | Status: DC

## 2018-10-30 NOTE — Progress Notes (Signed)
Here because she thinks she has a UTI because it hurts like it did before when had a UTI and has noticed urine has a bad odor. UA ran and postiive for UTI- ordered macrobid and urine culture. Explained to patient if culture shows other medication more effective -we may call her with new rx.  Also discussed she has missed her 17 p appointment and ob fu appt. Offered to give 17p today. She declines.States she doesn't want the 17p and has told the doctor that before.  Instructed her at checkout today to schedule ob fu. Also put in checkout note for registrar to schedule ob asap. She voices understanding.  Saif Peter,RN

## 2018-10-31 ENCOUNTER — Encounter: Payer: Self-pay | Admitting: Obstetrics and Gynecology

## 2018-10-31 ENCOUNTER — Ambulatory Visit (INDEPENDENT_AMBULATORY_CARE_PROVIDER_SITE_OTHER): Payer: BLUE CROSS/BLUE SHIELD | Admitting: Obstetrics and Gynecology

## 2018-10-31 VITALS — BP 144/76 | HR 93 | Wt 228.2 lb

## 2018-10-31 DIAGNOSIS — O34219 Maternal care for unspecified type scar from previous cesarean delivery: Secondary | ICD-10-CM

## 2018-10-31 DIAGNOSIS — Z3A25 25 weeks gestation of pregnancy: Secondary | ICD-10-CM

## 2018-10-31 DIAGNOSIS — I10 Essential (primary) hypertension: Secondary | ICD-10-CM

## 2018-10-31 DIAGNOSIS — O24912 Unspecified diabetes mellitus in pregnancy, second trimester: Secondary | ICD-10-CM

## 2018-10-31 DIAGNOSIS — O0990 Supervision of high risk pregnancy, unspecified, unspecified trimester: Secondary | ICD-10-CM

## 2018-10-31 DIAGNOSIS — Z8751 Personal history of pre-term labor: Secondary | ICD-10-CM

## 2018-10-31 DIAGNOSIS — E08319 Diabetes mellitus due to underlying condition with unspecified diabetic retinopathy without macular edema: Secondary | ICD-10-CM

## 2018-10-31 LAB — POCT URINALYSIS DIP (DEVICE)
Bilirubin Urine: NEGATIVE
Glucose, UA: NEGATIVE mg/dL
Nitrite: POSITIVE — AB
Protein, ur: 100 mg/dL — AB
Specific Gravity, Urine: 1.02 (ref 1.005–1.030)
Urobilinogen, UA: 1 mg/dL (ref 0.0–1.0)
pH: 6 (ref 5.0–8.0)

## 2018-10-31 NOTE — Progress Notes (Signed)
   PRENATAL VISIT NOTE  Subjective:  Yvette Patel is a 43 y.o. [id number] at [redacted]w[redacted]d being seen today for ongoing prenatal care.  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  Patient reports no complaints.  Contractions: Not present. Vag. Bleeding: None.  Movement: Present. Denies leaking of fluid.   The following portions of the patient's history were reviewed and updated as appropriate: allergies, current medications, past family history, past medical history, past social history, past surgical history and problem list. Problem list updated.  Objective:   Vitals:   10/31/18 1536  BP: (!) 144/76  Pulse: 93  Weight: 228 lb 3.2 oz (103.5 kg)    Fetal Status: Fetal Heart Rate (bpm): 155   Movement: Present     General:  Alert, oriented and cooperative. Patient is in no acute distress.  Skin: Skin is warm and dry. No rash noted.   Cardiovascular: Normal heart rate noted  Respiratory: Normal respiratory effort, no problems with respiration noted  Abdomen: Soft, gravid, appropriate for gestational age.  Pain/Pressure: Present     Pelvic: Cervical exam deferred        Extremities: Normal range of motion.  Edema: Trace  Mental Status: Normal mood and affect. Normal behavior. Normal judgment and thought content.   Assessment and Plan:  Pregnancy: [id number] at [redacted]w[redacted]d  1. Supervision of high risk pregnancy, antepartum  2. Previous cesarean delivery, antepartum Would like TOLAC, reviewed group policy about no induction s/p 2 c-section Informed patient likely repeat @ 39 weeks Have requested op report from Dr. Shawnie Pons  3. History of preterm delivery Declined  17P  4. Diabetes mellitus affecting pregnancy in second trimester Metformin 1000 mg BID levemir 14 units QHS CBG  FG: 80s-90s, states she is above 95 about once per week PP: all less than 120 - patient to see PCP for foot exam - last growth 42nd%tile, next growth 3/27  5. Chronic hypertension - BP stable - on procardia 30 mg XL (prescribed BID but she takes it once daily as she gets dizzy if she takes it twice daily) - cont baby ASA - UTD w nephrology  6. 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 Please refer to After Visit Summary for other counseling recommendations.  Return in about 2 weeks (around 11/14/2018) for OB visit (MD).  Future Appointments  Date Time Provider Department Center  11/22/2018  3:20 PM [facility] NURSE [facility] MFC-US  11/22/2018  3:30 PM WH-MFC [place] 5 WH-MFCUS MFC-US    Conan Bowens, MD

## 2018-10-31 NOTE — Progress Notes (Signed)
Patient seen and assessed by nursing staff.  Agree with documentation and plan.  

## 2018-11-02 LAB — URINE CULTURE, OB REFLEX

## 2018-11-02 LAB — CULTURE, OB URINE

## 2018-11-14 ENCOUNTER — Ambulatory Visit (INDEPENDENT_AMBULATORY_CARE_PROVIDER_SITE_OTHER): Payer: BLUE CROSS/BLUE SHIELD | Admitting: Family Medicine

## 2018-11-14 ENCOUNTER — Other Ambulatory Visit: Payer: Self-pay

## 2018-11-14 VITALS — BP 137/60 | HR 97 | Wt 233.0 lb

## 2018-11-14 DIAGNOSIS — I10 Essential (primary) hypertension: Secondary | ICD-10-CM

## 2018-11-14 DIAGNOSIS — Z3A27 27 weeks gestation of pregnancy: Secondary | ICD-10-CM

## 2018-11-14 DIAGNOSIS — Z23 Encounter for immunization: Secondary | ICD-10-CM | POA: Diagnosis not present

## 2018-11-14 DIAGNOSIS — O10012 Pre-existing essential hypertension complicating pregnancy, second trimester: Secondary | ICD-10-CM

## 2018-11-14 DIAGNOSIS — O0992 Supervision of high risk pregnancy, unspecified, second trimester: Secondary | ICD-10-CM

## 2018-11-14 DIAGNOSIS — O34219 Maternal care for unspecified type scar from previous cesarean delivery: Secondary | ICD-10-CM

## 2018-11-14 DIAGNOSIS — O24912 Unspecified diabetes mellitus in pregnancy, second trimester: Secondary | ICD-10-CM

## 2018-11-14 DIAGNOSIS — O0990 Supervision of high risk pregnancy, unspecified, unspecified trimester: Secondary | ICD-10-CM

## 2018-11-14 NOTE — Patient Instructions (Addendum)

## 2018-11-15 LAB — POCT URINALYSIS DIP (DEVICE)
Bilirubin Urine: NEGATIVE
Glucose, UA: NEGATIVE mg/dL
Leukocytes,Ua: NEGATIVE
Nitrite: NEGATIVE
Protein, ur: 100 mg/dL — AB
Specific Gravity, Urine: 1.02 (ref 1.005–1.030)
Urobilinogen, UA: 1 mg/dL (ref 0.0–1.0)
pH: 6.5 (ref 5.0–8.0)

## 2018-11-15 NOTE — Progress Notes (Signed)
   PRENATAL VISIT NOTE  Subjective:  Yvette Patel is a 43 y.o. [id number] at [redacted]w[redacted]d being seen today for ongoing prenatal care.  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  Patient reports no complaints.  Contractions: Not present. Vag. Bleeding: None.  Movement: Present. Denies leaking of fluid.   The following portions of the patient's history were reviewed and updated as appropriate: allergies, current medications, past family history, past medical history, past social history, past surgical history and problem list.   Objective:   Vitals:   11/14/18 1553  BP: 137/60  Pulse: 97  Weight: 233 lb (105.7 kg)    Fetal Status: Fetal Heart Rate (bpm): 143   Movement: Present     General:  Alert, oriented and cooperative. Patient is in no acute distress.  Skin: Skin is warm and dry. No rash noted.   Cardiovascular: Normal heart rate noted  Respiratory: Normal respiratory effort, no problems with respiration noted  Abdomen: Soft, gravid, appropriate for gestational age.  Pain/Pressure: Present     Pelvic: Cervical exam deferred        Extremities: Normal range of motion.  Edema: Trace  Mental Status: Normal mood and affect. Normal behavior. Normal judgment and thought content.   Assessment and Plan:  Pregnancy: [id number] at [redacted]w[redacted]d 1. Chronic hypertension BP is well controlled on Procardia and ASA Has BP cuff at home and can do virtual visits  2. Diabetes mellitus affecting pregnancy in second trimester No book today She see endocrinology who is adjusting her meds including--to email them on Friday  3. Supervision of high risk pregnancy, antepartum -  Tdap vaccine greater than or equal to 7yo IM  4. Previous cesarean delivery, antepartum Desires TOLAC if in spontaneous labor  Preterm labor symptoms and general obstetric precautions including but not limited to vaginal bleeding, contractions, leaking of fluid and fetal movement were reviewed in detail with the patient. Please refer to After Visit Summary for other counseling recommendations.   Return in 2 weeks (on 11/28/2018) for [facility].  Future Appointments  Date Time Provider Department Center  11/22/2018  3:20 PM [facility] NURSE [facility] MFC-US  11/22/2018  3:30 PM WH-MFC [place] 5 WH-MFCUS MFC-US  12/04/2018  4:15 PM Conan Bowens, MD [facility] WOC    Reva Bores, MD

## 2018-11-18 ENCOUNTER — Telehealth: Payer: Self-pay | Admitting: *Deleted

## 2018-11-18 NOTE — Telephone Encounter (Addendum)
Received a voicemessage from [facility] stating they are calling to schedule deliery of makena  And are waiting for office to call and let them know if she is still taking makena and needs next shipment.   Per review we have 4 doses and it does not appear pt has been getting 17p. Per chart on 10/30/18 patient declined 17P and states she does not want to take 17p injections. I called [employer] and informed them no further shipments as patient is declining 17p and we will call back if that changes. They voiced understanding.

## 2018-11-22 ENCOUNTER — Ambulatory Visit (HOSPITAL_COMMUNITY)
Admission: RE | Admit: 2018-11-22 | Discharge: 2018-11-22 | Disposition: A | Discharge status: Home / Self Care | Payer: BLUE CROSS/BLUE SHIELD | Source: Ambulatory Visit | Attending: Obstetrics and Gynecology | Admitting: Obstetrics and Gynecology

## 2018-11-22 ENCOUNTER — Other Ambulatory Visit: Payer: Self-pay

## 2018-11-22 ENCOUNTER — Ambulatory Visit (HOSPITAL_COMMUNITY): Payer: BLUE CROSS/BLUE SHIELD | Admitting: *Deleted

## 2018-11-22 ENCOUNTER — Encounter (HOSPITAL_COMMUNITY): Payer: Self-pay

## 2018-11-22 VITALS — BP 130/61 | HR 93 | Temp 98.7°F

## 2018-11-22 DIAGNOSIS — I10 Essential (primary) hypertension: Secondary | ICD-10-CM | POA: Insufficient documentation

## 2018-11-22 DIAGNOSIS — O09523 Supervision of elderly multigravida, third trimester: Secondary | ICD-10-CM

## 2018-11-22 DIAGNOSIS — Z362 Encounter for other antenatal screening follow-up: Secondary | ICD-10-CM

## 2018-11-22 DIAGNOSIS — O10013 Pre-existing essential hypertension complicating pregnancy, third trimester: Secondary | ICD-10-CM

## 2018-11-22 DIAGNOSIS — O0990 Supervision of high risk pregnancy, unspecified, unspecified trimester: Secondary | ICD-10-CM | POA: Insufficient documentation

## 2018-11-22 DIAGNOSIS — O09529 Supervision of elderly multigravida, unspecified trimester: Secondary | ICD-10-CM | POA: Diagnosis not present

## 2018-11-22 DIAGNOSIS — O09213 Supervision of pregnancy with history of pre-term labor, third trimester: Secondary | ICD-10-CM | POA: Diagnosis not present

## 2018-11-22 DIAGNOSIS — Z3A28 28 weeks gestation of pregnancy: Secondary | ICD-10-CM

## 2018-11-22 DIAGNOSIS — O24019 Pre-existing diabetes mellitus, type 1, in pregnancy, unspecified trimester: Secondary | ICD-10-CM | POA: Diagnosis present

## 2018-11-22 DIAGNOSIS — O24113 Pre-existing diabetes mellitus, type 2, in pregnancy, third trimester: Secondary | ICD-10-CM

## 2018-11-25 ENCOUNTER — Other Ambulatory Visit (HOSPITAL_COMMUNITY): Payer: Self-pay | Admitting: *Deleted

## 2018-11-25 DIAGNOSIS — IMO0001 Reserved for inherently not codable concepts without codable children: Secondary | ICD-10-CM

## 2018-11-25 DIAGNOSIS — Z794 Long term (current) use of insulin: Principal | ICD-10-CM

## 2018-11-25 DIAGNOSIS — O24313 Unspecified pre-existing diabetes mellitus in pregnancy, third trimester: Principal | ICD-10-CM

## 2018-12-04 ENCOUNTER — Encounter: Payer: BLUE CROSS/BLUE SHIELD | Admitting: Obstetrics and Gynecology

## 2018-12-12 ENCOUNTER — Telehealth: Payer: Self-pay | Admitting: Obstetrics and Gynecology

## 2018-12-12 NOTE — Telephone Encounter (Signed)
Called the patient back to schedule a lab appointment as she missed her 28 week.

## 2018-12-12 NOTE — Telephone Encounter (Signed)
The patient called in to verify upcoming appointment. Informed the patient of the next ultrasound and missed appointment with our clinic. Rescheduled the patient and informed of the virtual visit. No questions at this time.

## 2018-12-13 ENCOUNTER — Other Ambulatory Visit: Payer: BLUE CROSS/BLUE SHIELD

## 2018-12-13 ENCOUNTER — Other Ambulatory Visit: Payer: Self-pay

## 2018-12-13 DIAGNOSIS — O0990 Supervision of high risk pregnancy, unspecified, unspecified trimester: Secondary | ICD-10-CM

## 2018-12-14 LAB — CBC
Hematocrit: 31.9 % — ABNORMAL LOW (ref 34.0–46.6)
Hemoglobin: 10.4 g/dL — ABNORMAL LOW (ref 11.1–15.9)
MCH: 28 pg (ref 26.6–33.0)
MCHC: 32.6 g/dL (ref 31.5–35.7)
MCV: 86 fL (ref 79–97)
Platelets: 182 10*3/uL (ref 150–450)
RBC: 3.71 x10E6/uL — ABNORMAL LOW (ref 3.77–5.28)
RDW: 14.2 % (ref 11.7–15.4)
WBC: 7.2 10*3/uL (ref 3.4–10.8)

## 2018-12-14 LAB — HIV ANTIBODY (ROUTINE TESTING W REFLEX): HIV Screen 4th Generation wRfx: NONREACTIVE

## 2018-12-14 LAB — RPR: RPR Ser Ql: NONREACTIVE

## 2018-12-16 NOTE — Telephone Encounter (Signed)
Returned pt's call regarding babyscripts.  Pt did not pick up.  The home number stated that it was no longer connected and the mobile number rang until the message "this user has a voicemail that is not set up" was received.    Pt baby scripts account changed to optimization and permission given for pt to enter BP manually.  Per MD note on 3/19, pt has BP cuff at home.  Will need to verify and talk pt through adding BP manually.

## 2018-12-17 ENCOUNTER — Telehealth: Payer: Self-pay | Admitting: General Practice

## 2018-12-17 NOTE — Telephone Encounter (Signed)
Returned pt's call regarding babyscripts.  Pt did not pick up and the message "this user has a voicemail that is not set up" was received.  Pt has follow up appointment on 12/19/18. Will assist pt at that time to get set up with babyscripts.

## 2018-12-17 NOTE — Telephone Encounter (Signed)
Per review, patient has not entered any blood pressures into babyscripts yet. Called patient and she states she has been checking her BP at home but doesn't know how to enter it into babyscripts. Patient states she is at work so she cannot do it right now. Walked patient through how she would enter it into the app and save her BP. Asked she try and do it when she got home so she could inform us if there were any issues. Patient verbalized understanding & had no questions.

## 2018-12-18 ENCOUNTER — Telehealth: Payer: Self-pay | Admitting: Obstetrics and Gynecology

## 2018-12-18 NOTE — Telephone Encounter (Signed)
Called the patient to inform of upcoming appointment. Stated she does have access to blood pressure cuffs and informed of upcoming telephone visit.

## 2018-12-19 ENCOUNTER — Other Ambulatory Visit: Payer: Self-pay

## 2018-12-19 ENCOUNTER — Ambulatory Visit (INDEPENDENT_AMBULATORY_CARE_PROVIDER_SITE_OTHER): Payer: BLUE CROSS/BLUE SHIELD | Admitting: Obstetrics & Gynecology

## 2018-12-19 VITALS — BP 130/72

## 2018-12-19 DIAGNOSIS — O24913 Unspecified diabetes mellitus in pregnancy, third trimester: Secondary | ICD-10-CM

## 2018-12-19 DIAGNOSIS — O0993 Supervision of high risk pregnancy, unspecified, third trimester: Secondary | ICD-10-CM

## 2018-12-19 DIAGNOSIS — I10 Essential (primary) hypertension: Secondary | ICD-10-CM

## 2018-12-19 DIAGNOSIS — Z3A32 32 weeks gestation of pregnancy: Secondary | ICD-10-CM

## 2018-12-19 DIAGNOSIS — O0990 Supervision of high risk pregnancy, unspecified, unspecified trimester: Secondary | ICD-10-CM

## 2018-12-19 NOTE — Progress Notes (Signed)
   TELEHEALTH VIRTUAL OBSTETRICS VISIT ENCOUNTER NOTE  I connected with [name] on 12/19/18 at  2:15 PM EDT by telephone at home and verified that I am speaking with the correct person using two identifiers.   I discussed the limitations, risks, security and privacy concerns of performing an evaluation and management service by telephone and the availability of in person appointments. I also discussed with the patient that there may be a patient responsible charge related to this service. The patient expressed understanding and agreed to proceed.  Subjective:  Yvette Patel is a 43 y.o. [id number] at [redacted]w[redacted]d being followed for ongoing prenatal care.  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  Patient reports was unable to enter BG on Babyscripts, but values are normal on adjusted insulin regime. Reports fetal movement. Denies any contractions, bleeding or leaking of fluid.   The following portions of the patient's history were reviewed and updated as appropriate: allergies, current medications, past family history, past medical history, past social history, past surgical history and problem list.   Objective:   General:  Alert, oriented and cooperative.   Mental Status: Normal mood and affect perceived. Normal judgment and thought content.  Rest of physical exam deferred due to type  Assessment and Plan:  Pregnancy: [id number] at [redacted]w[redacted]d 1. Chronic hypertension Good control  2. Diabetes mellitus affecting pregnancy in third trimester Continue present medication  3. Supervision of high risk pregnancy, antepartum   Preterm labor symptoms and general  obstetric precautions including but not limited to vaginal bleeding, contractions, leaking of fluid and fetal movement were reviewed in detail with the patient.  I discussed the assessment and treatment plan with the patient. The patient was provided an opportunity to ask questions and all were answered. The patient agreed with the plan and demonstrated an understanding of the instructions. The patient was advised to call back or seek an in-person office evaluation/go to [facility]U at [facility] for any urgent or concerning symptoms. Please refer to After Visit Summary for other counseling recommendations.   I provided 10 minutes of non-face-to-face time during this encounter.  Return in about 2 weeks (around 01/02/2019) for virtual.  Future Appointments  Date Time Provider Department Center  12/27/2018 10:30 AM [facility]-MFC NURSE WH-MFC MFC-US  12/27/2018 10:30 AM WH-MFC [place] 5 WH-MFCUS MFC-US    Scheryl Darter, MD Center for [facility], [facility]

## 2018-12-19 NOTE — Progress Notes (Signed)
Pt states has problems with BRx

## 2018-12-19 NOTE — Patient Instructions (Signed)

## 2018-12-25 ENCOUNTER — Telehealth (INDEPENDENT_AMBULATORY_CARE_PROVIDER_SITE_OTHER): Payer: BLUE CROSS/BLUE SHIELD | Admitting: *Deleted

## 2018-12-25 DIAGNOSIS — Z3490 Encounter for supervision of normal pregnancy, unspecified, unspecified trimester: Secondary | ICD-10-CM

## 2018-12-25 NOTE — Telephone Encounter (Signed)
Called pt regarding babyscripts.  Per chart review, it was noted that the pt has never recorded a BP.  Pt stated that she did not have any problems with baby scripts and has been documenting her blood sugars and was going to start charting her BP on Friday.  Requested that pt check a BP today and log it into the app and that if she has any difficulties to call us.  Pt stated that she would do that.

## 2018-12-27 ENCOUNTER — Ambulatory Visit (HOSPITAL_BASED_OUTPATIENT_CLINIC_OR_DEPARTMENT_OTHER): Payer: BLUE CROSS/BLUE SHIELD | Admitting: *Deleted

## 2018-12-27 ENCOUNTER — Other Ambulatory Visit: Payer: Self-pay

## 2018-12-27 ENCOUNTER — Ambulatory Visit (HOSPITAL_COMMUNITY): Payer: BLUE CROSS/BLUE SHIELD | Admitting: *Deleted

## 2018-12-27 ENCOUNTER — Encounter (HOSPITAL_COMMUNITY): Payer: Self-pay

## 2018-12-27 ENCOUNTER — Ambulatory Visit (HOSPITAL_COMMUNITY)
Admission: RE | Admit: 2018-12-27 | Discharge: 2018-12-27 | Disposition: A | Discharge status: Home / Self Care | Payer: BLUE CROSS/BLUE SHIELD | Source: Ambulatory Visit | Attending: Obstetrics and Gynecology | Admitting: Obstetrics and Gynecology

## 2018-12-27 ENCOUNTER — Other Ambulatory Visit (HOSPITAL_COMMUNITY): Payer: Self-pay | Admitting: Obstetrics and Gynecology

## 2018-12-27 VITALS — BP 136/77 | HR 102 | Temp 98.3°F

## 2018-12-27 DIAGNOSIS — Z362 Encounter for other antenatal screening follow-up: Secondary | ICD-10-CM

## 2018-12-27 DIAGNOSIS — O24113 Pre-existing diabetes mellitus, type 2, in pregnancy, third trimester: Secondary | ICD-10-CM

## 2018-12-27 DIAGNOSIS — Z794 Long term (current) use of insulin: Principal | ICD-10-CM

## 2018-12-27 DIAGNOSIS — Z3A33 33 weeks gestation of pregnancy: Secondary | ICD-10-CM | POA: Diagnosis not present

## 2018-12-27 DIAGNOSIS — O09213 Supervision of pregnancy with history of pre-term labor, third trimester: Secondary | ICD-10-CM | POA: Diagnosis not present

## 2018-12-27 DIAGNOSIS — O09529 Supervision of elderly multigravida, unspecified trimester: Secondary | ICD-10-CM | POA: Diagnosis present

## 2018-12-27 DIAGNOSIS — O09523 Supervision of elderly multigravida, third trimester: Secondary | ICD-10-CM

## 2018-12-27 DIAGNOSIS — O24313 Unspecified pre-existing diabetes mellitus in pregnancy, third trimester: Secondary | ICD-10-CM | POA: Diagnosis not present

## 2018-12-27 DIAGNOSIS — IMO0001 Reserved for inherently not codable concepts without codable children: Secondary | ICD-10-CM

## 2018-12-27 DIAGNOSIS — O10919 Unspecified pre-existing hypertension complicating pregnancy, unspecified trimester: Secondary | ICD-10-CM

## 2018-12-27 DIAGNOSIS — O10013 Pre-existing essential hypertension complicating pregnancy, third trimester: Secondary | ICD-10-CM | POA: Diagnosis not present

## 2018-12-27 NOTE — Procedures (Signed)
Yvette Patel 10-02-1975 [redacted]w[redacted]d  Fetus A Non-Stress Test Interpretation for 12/27/18  Indication: Unsatisfactory BPP  Fetal Heart Rate A Mode: External Baseline Rate (A): 140 bpm Variability: Moderate Accelerations: 15 x 15 Decelerations: None Multiple birth?: No  Uterine Activity Mode: Palpation, Toco Contraction Frequency (min): None Resting Tone Palpated: Relaxed Resting Time: Adequate  Interpretation (Fetal Testing) Nonstress Test Interpretation: Reactive Overall Impression: Reassuring for gestational age Comments: EFM tracing reviewed by Dr. Grace Bushy

## 2018-12-31 ENCOUNTER — Other Ambulatory Visit (HOSPITAL_COMMUNITY): Payer: Self-pay | Admitting: *Deleted

## 2018-12-31 DIAGNOSIS — O24119 Pre-existing diabetes mellitus, type 2, in pregnancy, unspecified trimester: Secondary | ICD-10-CM

## 2018-12-31 DIAGNOSIS — Z794 Long term (current) use of insulin: Principal | ICD-10-CM

## 2019-01-01 ENCOUNTER — Ambulatory Visit (HOSPITAL_COMMUNITY): Payer: BLUE CROSS/BLUE SHIELD | Admitting: *Deleted

## 2019-01-01 ENCOUNTER — Ambulatory Visit (HOSPITAL_COMMUNITY)
Admission: RE | Admit: 2019-01-01 | Discharge: 2019-01-01 | Disposition: A | Discharge status: Home / Self Care | Payer: BLUE CROSS/BLUE SHIELD | Source: Ambulatory Visit | Attending: Maternal & Fetal Medicine | Admitting: Maternal & Fetal Medicine

## 2019-01-01 ENCOUNTER — Other Ambulatory Visit: Payer: Self-pay

## 2019-01-01 ENCOUNTER — Encounter (HOSPITAL_COMMUNITY): Payer: Self-pay | Admitting: *Deleted

## 2019-01-01 DIAGNOSIS — Z794 Long term (current) use of insulin: Secondary | ICD-10-CM | POA: Insufficient documentation

## 2019-01-01 DIAGNOSIS — O0990 Supervision of high risk pregnancy, unspecified, unspecified trimester: Secondary | ICD-10-CM | POA: Diagnosis present

## 2019-01-01 DIAGNOSIS — I10 Essential (primary) hypertension: Secondary | ICD-10-CM | POA: Diagnosis present

## 2019-01-01 DIAGNOSIS — O10013 Pre-existing essential hypertension complicating pregnancy, third trimester: Secondary | ICD-10-CM

## 2019-01-01 DIAGNOSIS — O24113 Pre-existing diabetes mellitus, type 2, in pregnancy, third trimester: Secondary | ICD-10-CM | POA: Diagnosis not present

## 2019-01-01 DIAGNOSIS — O24119 Pre-existing diabetes mellitus, type 2, in pregnancy, unspecified trimester: Secondary | ICD-10-CM | POA: Insufficient documentation

## 2019-01-01 DIAGNOSIS — O09523 Supervision of elderly multigravida, third trimester: Secondary | ICD-10-CM

## 2019-01-01 DIAGNOSIS — O09213 Supervision of pregnancy with history of pre-term labor, third trimester: Secondary | ICD-10-CM

## 2019-01-01 DIAGNOSIS — Z3A34 34 weeks gestation of pregnancy: Secondary | ICD-10-CM

## 2019-01-06 ENCOUNTER — Other Ambulatory Visit: Payer: Self-pay

## 2019-01-06 ENCOUNTER — Telehealth: Payer: Self-pay | Admitting: Obstetrics & Gynecology

## 2019-01-06 ENCOUNTER — Ambulatory Visit: Payer: BLUE CROSS/BLUE SHIELD | Admitting: Obstetrics & Gynecology

## 2019-01-06 DIAGNOSIS — I10 Essential (primary) hypertension: Secondary | ICD-10-CM

## 2019-01-06 DIAGNOSIS — O99210 Obesity complicating pregnancy, unspecified trimester: Secondary | ICD-10-CM | POA: Insufficient documentation

## 2019-01-06 DIAGNOSIS — Z8751 Personal history of pre-term labor: Secondary | ICD-10-CM

## 2019-01-06 DIAGNOSIS — O09529 Supervision of elderly multigravida, unspecified trimester: Secondary | ICD-10-CM

## 2019-01-06 DIAGNOSIS — O34219 Maternal care for unspecified type scar from previous cesarean delivery: Secondary | ICD-10-CM

## 2019-01-06 DIAGNOSIS — O0990 Supervision of high risk pregnancy, unspecified, unspecified trimester: Secondary | ICD-10-CM

## 2019-01-06 DIAGNOSIS — O24913 Unspecified diabetes mellitus in pregnancy, third trimester: Secondary | ICD-10-CM

## 2019-01-06 NOTE — Progress Notes (Signed)
Did not keep appt

## 2019-01-06 NOTE — Telephone Encounter (Signed)
Called the patient to confirm the appointment, the patient verbalized understating. °

## 2019-01-06 NOTE — Progress Notes (Signed)
No answer/ no vm set up 

## 2019-01-08 ENCOUNTER — Encounter (HOSPITAL_COMMUNITY): Payer: Self-pay | Admitting: *Deleted

## 2019-01-08 ENCOUNTER — Other Ambulatory Visit: Payer: Self-pay

## 2019-01-08 ENCOUNTER — Ambulatory Visit (HOSPITAL_COMMUNITY)
Admission: RE | Admit: 2019-01-08 | Discharge: 2019-01-08 | Disposition: A | Discharge status: Home / Self Care | Payer: BLUE CROSS/BLUE SHIELD | Source: Ambulatory Visit | Attending: Maternal & Fetal Medicine | Admitting: Maternal & Fetal Medicine

## 2019-01-08 ENCOUNTER — Ambulatory Visit (HOSPITAL_COMMUNITY): Payer: BLUE CROSS/BLUE SHIELD | Admitting: *Deleted

## 2019-01-08 DIAGNOSIS — O0990 Supervision of high risk pregnancy, unspecified, unspecified trimester: Secondary | ICD-10-CM | POA: Diagnosis present

## 2019-01-08 DIAGNOSIS — O24113 Pre-existing diabetes mellitus, type 2, in pregnancy, third trimester: Secondary | ICD-10-CM

## 2019-01-08 DIAGNOSIS — I10 Essential (primary) hypertension: Secondary | ICD-10-CM | POA: Diagnosis present

## 2019-01-08 DIAGNOSIS — Z794 Long term (current) use of insulin: Secondary | ICD-10-CM | POA: Diagnosis present

## 2019-01-08 DIAGNOSIS — O09529 Supervision of elderly multigravida, unspecified trimester: Secondary | ICD-10-CM | POA: Diagnosis present

## 2019-01-08 DIAGNOSIS — O24119 Pre-existing diabetes mellitus, type 2, in pregnancy, unspecified trimester: Secondary | ICD-10-CM | POA: Insufficient documentation

## 2019-01-08 DIAGNOSIS — O99210 Obesity complicating pregnancy, unspecified trimester: Secondary | ICD-10-CM | POA: Diagnosis present

## 2019-01-08 DIAGNOSIS — O10013 Pre-existing essential hypertension complicating pregnancy, third trimester: Secondary | ICD-10-CM | POA: Diagnosis not present

## 2019-01-08 DIAGNOSIS — O09523 Supervision of elderly multigravida, third trimester: Secondary | ICD-10-CM | POA: Diagnosis not present

## 2019-01-08 DIAGNOSIS — Z3A35 35 weeks gestation of pregnancy: Secondary | ICD-10-CM

## 2019-01-08 DIAGNOSIS — O09213 Supervision of pregnancy with history of pre-term labor, third trimester: Secondary | ICD-10-CM

## 2019-01-08 NOTE — Procedures (Signed)
Yvette Patel Apr 15, 1976 [redacted]w[redacted]d  Fetus A Non-Stress Test Interpretation for 01/08/19  Indication: Unsatisfactory BPP  Fetal Heart Rate A Mode: External Baseline Rate (A): 145 bpm Variability: Moderate Accelerations: 15 x 15 Decelerations: None Multiple birth?: No  Uterine Activity Mode: Toco Contraction Frequency (min): none noted  Interpretation (Fetal Testing) Nonstress Test Interpretation: Reactive Comments: FHR tracing rev'd by Dr. Grace Bushy

## 2019-01-09 ENCOUNTER — Other Ambulatory Visit (HOSPITAL_COMMUNITY): Payer: Self-pay | Admitting: Obstetrics and Gynecology

## 2019-01-09 DIAGNOSIS — O24119 Pre-existing diabetes mellitus, type 2, in pregnancy, unspecified trimester: Secondary | ICD-10-CM

## 2019-01-13 ENCOUNTER — Encounter: Payer: BLUE CROSS/BLUE SHIELD | Admitting: Obstetrics and Gynecology

## 2019-01-13 ENCOUNTER — Telehealth: Payer: Self-pay | Admitting: Obstetrics and Gynecology

## 2019-01-13 NOTE — Telephone Encounter (Signed)
Called patient to get her rescheduled for her missed appointment. She is 36 weeks and needed cultures. Patient stated she could only come in the afternoon due to work.

## 2019-01-14 ENCOUNTER — Ambulatory Visit (INDEPENDENT_AMBULATORY_CARE_PROVIDER_SITE_OTHER): Payer: BLUE CROSS/BLUE SHIELD | Admitting: Obstetrics & Gynecology

## 2019-01-14 ENCOUNTER — Other Ambulatory Visit: Payer: Self-pay

## 2019-01-14 VITALS — BP 122/82 | HR 105 | Temp 98.0°F | Wt 239.0 lb

## 2019-01-14 DIAGNOSIS — O34219 Maternal care for unspecified type scar from previous cesarean delivery: Secondary | ICD-10-CM

## 2019-01-14 DIAGNOSIS — O99210 Obesity complicating pregnancy, unspecified trimester: Secondary | ICD-10-CM

## 2019-01-14 DIAGNOSIS — Z3A35 35 weeks gestation of pregnancy: Secondary | ICD-10-CM

## 2019-01-14 DIAGNOSIS — O09523 Supervision of elderly multigravida, third trimester: Secondary | ICD-10-CM

## 2019-01-14 DIAGNOSIS — Z113 Encounter for screening for infections with a predominantly sexual mode of transmission: Secondary | ICD-10-CM | POA: Diagnosis not present

## 2019-01-14 DIAGNOSIS — O0990 Supervision of high risk pregnancy, unspecified, unspecified trimester: Secondary | ICD-10-CM

## 2019-01-14 DIAGNOSIS — Z8751 Personal history of pre-term labor: Secondary | ICD-10-CM

## 2019-01-14 DIAGNOSIS — I10 Essential (primary) hypertension: Secondary | ICD-10-CM

## 2019-01-14 DIAGNOSIS — O24913 Unspecified diabetes mellitus in pregnancy, third trimester: Secondary | ICD-10-CM

## 2019-01-14 DIAGNOSIS — O99213 Obesity complicating pregnancy, third trimester: Secondary | ICD-10-CM

## 2019-01-14 NOTE — Progress Notes (Signed)
   PRENATAL VISIT NOTE  Subjective:  Yvette Patel is a 43 y.o. [id number] at [redacted]w[redacted]d being seen today for ongoing prenatal care.  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  Patient reports no complaints.   .  .   . Denies leaking of fluid.   The following portions of the patient's history were reviewed and updated as appropriate: allergies, current medications, past family history, past medical history, past social history, past surgical history and problem list.   Objective:  There were no vitals filed for this visit.  Fetal Status:           General:  Alert, oriented and cooperative. Patient is in no acute distress.  Skin: Skin is warm and dry. No rash noted.   Cardiovascular: Normal heart rate noted  Respiratory: Normal respiratory effort, no problems with respiration noted  Abdomen: Soft, gravid, appropriate for gestational age.        Pelvic: Cervical exam performed        Extremities: Normal range of motion.     Mental Status: Normal mood and affect. Normal behavior. Normal judgment and thought content.   Assessment and Plan:  Pregnancy: [id number] at [redacted]w[redacted]d 1. Supervision of high risk pregnancy, antepartum  - Culture, beta strep (group b only) - GC/Chlamydia probe amp ([place])not at [facility]  2. History of preterm delivery - declined 17 P  3. Previous cesarean delivery x 2, antepartum - I sent [place] an email to schedule RLTCS at 39 weeks, she declines TOLAC  4. Chronic hypertension - on procardia  5. Obesity in pregnancy   6. Diabetes mellitus affecting pregnancy in  third trimester - Her recorded sugars are all in range - weekly MFM BPP - u/s tomorrow  7. Multigravida of advanced maternal age in third trimester   Preterm labor symptoms and general obstetric precautions including but not limited to vaginal bleeding, contractions, leaking of fluid and fetal movement were reviewed in detail with the patient. Please refer to After Visit Summary for other counseling recommendations.   No follow-ups on file.  Future Appointments  Date Time Provider Department Center  01/15/2019  3:00 PM [facility] NURSE [facility] MFC-US  01/15/2019  3:00 PM WH-MFC [place] 3 WH-MFCUS MFC-US  01/22/2019  3:00 PM WH-MFC NURSE WH-MFC MFC-US  01/22/2019  3:00 PM WH-MFC [place] 3 WH-MFCUS MFC-US    Allie Bossier, MD

## 2019-01-15 ENCOUNTER — Encounter (HOSPITAL_COMMUNITY): Payer: Self-pay

## 2019-01-15 ENCOUNTER — Ambulatory Visit (HOSPITAL_COMMUNITY)
Admission: RE | Admit: 2019-01-15 | Discharge: 2019-01-15 | Disposition: A | Discharge status: Home / Self Care | Payer: BLUE CROSS/BLUE SHIELD | Source: Ambulatory Visit | Attending: Maternal & Fetal Medicine | Admitting: Maternal & Fetal Medicine

## 2019-01-15 ENCOUNTER — Ambulatory Visit (HOSPITAL_COMMUNITY): Payer: BLUE CROSS/BLUE SHIELD | Admitting: *Deleted

## 2019-01-15 VITALS — BP 124/61 | HR 105

## 2019-01-15 DIAGNOSIS — O09523 Supervision of elderly multigravida, third trimester: Secondary | ICD-10-CM

## 2019-01-15 DIAGNOSIS — O24119 Pre-existing diabetes mellitus, type 2, in pregnancy, unspecified trimester: Secondary | ICD-10-CM | POA: Diagnosis present

## 2019-01-15 DIAGNOSIS — O10013 Pre-existing essential hypertension complicating pregnancy, third trimester: Secondary | ICD-10-CM | POA: Diagnosis not present

## 2019-01-15 DIAGNOSIS — O0990 Supervision of high risk pregnancy, unspecified, unspecified trimester: Secondary | ICD-10-CM | POA: Insufficient documentation

## 2019-01-15 DIAGNOSIS — O99210 Obesity complicating pregnancy, unspecified trimester: Secondary | ICD-10-CM

## 2019-01-15 DIAGNOSIS — Z794 Long term (current) use of insulin: Secondary | ICD-10-CM | POA: Insufficient documentation

## 2019-01-15 DIAGNOSIS — O09213 Supervision of pregnancy with history of pre-term labor, third trimester: Secondary | ICD-10-CM | POA: Diagnosis not present

## 2019-01-15 DIAGNOSIS — I10 Essential (primary) hypertension: Secondary | ICD-10-CM

## 2019-01-15 DIAGNOSIS — O34219 Maternal care for unspecified type scar from previous cesarean delivery: Secondary | ICD-10-CM

## 2019-01-15 DIAGNOSIS — O24113 Pre-existing diabetes mellitus, type 2, in pregnancy, third trimester: Secondary | ICD-10-CM

## 2019-01-15 DIAGNOSIS — Z3A36 36 weeks gestation of pregnancy: Secondary | ICD-10-CM

## 2019-01-16 LAB — GC/CHLAMYDIA PROBE AMP (~~LOC~~) NOT AT ARMC
Chlamydia: NEGATIVE
Neisseria Gonorrhea: NEGATIVE

## 2019-01-17 ENCOUNTER — Encounter: Payer: Self-pay | Admitting: Obstetrics & Gynecology

## 2019-01-17 DIAGNOSIS — O99820 Streptococcus B carrier state complicating pregnancy: Secondary | ICD-10-CM | POA: Insufficient documentation

## 2019-01-17 LAB — CULTURE, BETA STREP (GROUP B ONLY): Strep Gp B Culture: POSITIVE — AB

## 2019-01-21 ENCOUNTER — Other Ambulatory Visit: Payer: Self-pay

## 2019-01-21 ENCOUNTER — Ambulatory Visit (INDEPENDENT_AMBULATORY_CARE_PROVIDER_SITE_OTHER): Payer: BLUE CROSS/BLUE SHIELD | Admitting: Obstetrics & Gynecology

## 2019-01-21 VITALS — BP 120/67 | HR 106

## 2019-01-21 DIAGNOSIS — O09523 Supervision of elderly multigravida, third trimester: Secondary | ICD-10-CM

## 2019-01-21 DIAGNOSIS — O24913 Unspecified diabetes mellitus in pregnancy, third trimester: Secondary | ICD-10-CM

## 2019-01-21 DIAGNOSIS — O99210 Obesity complicating pregnancy, unspecified trimester: Secondary | ICD-10-CM

## 2019-01-21 DIAGNOSIS — O34219 Maternal care for unspecified type scar from previous cesarean delivery: Secondary | ICD-10-CM

## 2019-01-21 DIAGNOSIS — I10 Essential (primary) hypertension: Secondary | ICD-10-CM

## 2019-01-21 DIAGNOSIS — O0990 Supervision of high risk pregnancy, unspecified, unspecified trimester: Secondary | ICD-10-CM

## 2019-01-21 DIAGNOSIS — Z3A36 36 weeks gestation of pregnancy: Secondary | ICD-10-CM

## 2019-01-21 DIAGNOSIS — O0993 Supervision of high risk pregnancy, unspecified, third trimester: Secondary | ICD-10-CM

## 2019-01-21 DIAGNOSIS — O99213 Obesity complicating pregnancy, third trimester: Secondary | ICD-10-CM

## 2019-01-21 NOTE — Progress Notes (Signed)
I connected with  Yvette Patel on 01/21/19 at  2:15 PM EDT by telephone and verified that I am speaking with the correct person using two identifiers.   I discussed the limitations, risks, security and privacy concerns of performing an evaluation and management service by telephone and the availability of in person appointments. I also discussed with the patient that there may be a patient responsible charge related to this service. The patient expressed understanding and agreed to proceed.  [name] Main, CMA 01/21/2019  2:27 PM   Pt states she does not want to have a c-section and wants to try vaginal delivery.

## 2019-01-21 NOTE — Progress Notes (Signed)
TELEHEALTH VIRTUAL OBSTETRICS VISIT ENCOUNTER NOTE  I connected with [name] on 01/21/19 at  2:15 PM EDT by telephone at home and verified that I am speaking with the correct person using two identifiers.   I discussed the limitations, risks, security and privacy concerns of performing an evaluation and management service by telephone and the availability of in person appointments. I also discussed with the patient that there may be a patient responsible charge related to this service. The patient expressed understanding and agreed to proceed.  Subjective:  Yvette Patel is a 43 y.o. [id number] at [redacted]w[redacted]d being followed for ongoing prenatal care.  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  Patient reports no complaints. Reports fetal movement. Denies any contractions, bleeding or leaking of fluid.   The following portions of the patient's history were reviewed and updated as appropriate: allergies, current medications, past family history, past medical history, past social history, past surgical history and problem list.   Objective:   General:  Alert, oriented and cooperative.   Mental Status: Normal mood and affect perceived. Normal judgment and thought content.  Rest of physical exam deferred due to type of encounter  Assessment and Plan:  Pregnancy: [id number] at [redacted]w[redacted]d 1. Supervision of high risk pregnancy, antepartum  2. Previous cesarean x 2 delivery, antepartum - she is scheduled for a repeat at 39 weeks, but  she would prefer a TOLAC. We have discussed that if she goes into labor prior to 39 weeks, then she can have a TOLAC but if she reaches 39 weeks, then she should have the RLTCS. She seemed amenable to this.  3. Obesity in pregnancy - she can get weighed tomorrow here as she does not have a scale at home  4. Chronic hypertension - on procardia 30 BID  5. Diabetes mellitus affecting pregnancy in third trimester - she is logging her sugars on Baby scripts, only a few out of range, on insulin - has weekly BPP and growth u/s tomorrow with MFM  6. Multigravida of advanced maternal age in third trimester   Preterm labor symptoms and general obstetric precautions including but not limited to vaginal bleeding, contractions, leaking of fluid and fetal movement were reviewed in detail with the patient.  I discussed the assessment and treatment plan with the patient. The patient was provided an opportunity to ask questions and all were answered. The patient agreed with the plan and demonstrated an understanding of the instructions. The patient was advised to call back or seek an in-person office evaluation/go to MAU at [facility] for any urgent or concerning symptoms. Please refer to After Visit Summary for other counseling recommendations.   I provided of non-face-to-face time during this encounter.  No follow-ups on file.  Future Appointments  Date Time Provider Department Center  01/22/2019  3:00 PM [facility] NURSE [facility] MFC-US  01/22/2019  3:00 PM WH-MFC [place] 3 WH-MFCUS MFC-US  01/29/2019  2:35 PM Reva Bores, MD WOC-WOCA WOC  02/06/2019 10:35 AM Allie Bossier, MD WOC-WOCA WOC    Allie Bossier, MD Center for [facility], [facility]

## 2019-01-22 ENCOUNTER — Encounter (HOSPITAL_COMMUNITY): Payer: Self-pay

## 2019-01-22 ENCOUNTER — Other Ambulatory Visit: Payer: Self-pay | Admitting: Obstetrics and Gynecology

## 2019-01-22 ENCOUNTER — Ambulatory Visit (HOSPITAL_COMMUNITY): Payer: BLUE CROSS/BLUE SHIELD | Admitting: *Deleted

## 2019-01-22 ENCOUNTER — Ambulatory Visit (HOSPITAL_COMMUNITY)
Admission: RE | Admit: 2019-01-22 | Discharge: 2019-01-22 | Disposition: A | Discharge status: Home / Self Care | Payer: BLUE CROSS/BLUE SHIELD | Source: Ambulatory Visit | Attending: Maternal & Fetal Medicine | Admitting: Maternal & Fetal Medicine

## 2019-01-22 ENCOUNTER — Other Ambulatory Visit: Payer: Self-pay

## 2019-01-22 VITALS — BP 137/77 | HR 97 | Temp 98.7°F

## 2019-01-22 DIAGNOSIS — O99820 Streptococcus B carrier state complicating pregnancy: Secondary | ICD-10-CM | POA: Insufficient documentation

## 2019-01-22 DIAGNOSIS — O10013 Pre-existing essential hypertension complicating pregnancy, third trimester: Secondary | ICD-10-CM | POA: Diagnosis not present

## 2019-01-22 DIAGNOSIS — O24119 Pre-existing diabetes mellitus, type 2, in pregnancy, unspecified trimester: Secondary | ICD-10-CM | POA: Diagnosis not present

## 2019-01-22 DIAGNOSIS — O0990 Supervision of high risk pregnancy, unspecified, unspecified trimester: Secondary | ICD-10-CM | POA: Insufficient documentation

## 2019-01-22 DIAGNOSIS — O99210 Obesity complicating pregnancy, unspecified trimester: Secondary | ICD-10-CM | POA: Insufficient documentation

## 2019-01-22 DIAGNOSIS — O09523 Supervision of elderly multigravida, third trimester: Secondary | ICD-10-CM

## 2019-01-22 DIAGNOSIS — I10 Essential (primary) hypertension: Secondary | ICD-10-CM | POA: Diagnosis present

## 2019-01-22 DIAGNOSIS — O09529 Supervision of elderly multigravida, unspecified trimester: Secondary | ICD-10-CM | POA: Diagnosis present

## 2019-01-22 DIAGNOSIS — Z362 Encounter for other antenatal screening follow-up: Secondary | ICD-10-CM | POA: Diagnosis not present

## 2019-01-22 DIAGNOSIS — O09213 Supervision of pregnancy with history of pre-term labor, third trimester: Secondary | ICD-10-CM | POA: Diagnosis not present

## 2019-01-22 DIAGNOSIS — Z794 Long term (current) use of insulin: Secondary | ICD-10-CM | POA: Diagnosis present

## 2019-01-22 DIAGNOSIS — O34219 Maternal care for unspecified type scar from previous cesarean delivery: Secondary | ICD-10-CM

## 2019-01-22 DIAGNOSIS — Z3A37 37 weeks gestation of pregnancy: Secondary | ICD-10-CM

## 2019-01-22 DIAGNOSIS — O24113 Pre-existing diabetes mellitus, type 2, in pregnancy, third trimester: Secondary | ICD-10-CM

## 2019-01-24 ENCOUNTER — Encounter (HOSPITAL_COMMUNITY): Payer: Self-pay | Admitting: *Deleted

## 2019-01-24 NOTE — Patient Instructions (Addendum)
Yvette Patel  01/24/2019   Your procedure is scheduled on:  02/05/2019  Arrive at 0730 at [employer] [address] on [address] at [facility]  and [employer]. You are invited to use the FREE valet parking or use the Visitor's parking deck.  Pick up the phone at the desk and dial [phone].  Call this number if you have problems the morning of surgery: [phone]  Remember:   Do not eat food:(After Midnight) Desps de medianoche.  Do not drink clear liquids: (After Midnight) Desps de medianoche.  Take these medicines the morning of surgery with A SIP OF WATER:  Take your procardia as prescribed.  Do not take any metformin the night before your surgery or the morning of surgery.  Take 10 units of Levamir insulin at bedtime the night before surgery and NO INSULIN the day of surgery.   Do not wear jewelry, make-up or nail polish.  Do not wear lotions, powders, or perfumes. Do not wear deodorant.  Do not shave 48 hours prior to surgery.  Do not bring valuables to the hospital.  [facility] is not   responsible for any belongings or valuables brought to the hospital.  Contacts, dentures or bridgework may not be worn into surgery.  Leave suitcase in the car. After surgery it may be brought to your room.  For patients admitted to the hospital, checkout time is 11:00 AM the day of              discharge.      Please read over the following fact sheets that you were given:     Preparing for Surgery

## 2019-01-28 ENCOUNTER — Telehealth: Payer: Self-pay | Admitting: General Practice

## 2019-01-28 DIAGNOSIS — O24913 Unspecified diabetes mellitus in pregnancy, third trimester: Secondary | ICD-10-CM

## 2019-01-28 NOTE — Telephone Encounter (Signed)
Received alert from Babyscripts that patient triggered for multiple elevated blood sugar values. Per chart review, patient is seeing Dr Shawnie Pons tomorrow for [facility] visit. Values will be further reviewed at that time.

## 2019-01-29 ENCOUNTER — Other Ambulatory Visit: Payer: Self-pay

## 2019-01-29 ENCOUNTER — Ambulatory Visit (INDEPENDENT_AMBULATORY_CARE_PROVIDER_SITE_OTHER): Payer: BLUE CROSS/BLUE SHIELD | Admitting: Family Medicine

## 2019-01-29 DIAGNOSIS — Z3A38 38 weeks gestation of pregnancy: Secondary | ICD-10-CM

## 2019-01-29 DIAGNOSIS — O0990 Supervision of high risk pregnancy, unspecified, unspecified trimester: Secondary | ICD-10-CM

## 2019-01-29 DIAGNOSIS — O24913 Unspecified diabetes mellitus in pregnancy, third trimester: Secondary | ICD-10-CM

## 2019-01-29 DIAGNOSIS — O99210 Obesity complicating pregnancy, unspecified trimester: Secondary | ICD-10-CM

## 2019-01-29 DIAGNOSIS — O99213 Obesity complicating pregnancy, third trimester: Secondary | ICD-10-CM

## 2019-01-29 NOTE — Progress Notes (Addendum)
   TELEHEALTH OBSTETRICS PRENATAL TELEPHONE VISIT ENCOUNTER NOTE  Provider location: Center for [facility] at [facility]   I connected with Yvette Patel on 01/29/19 at  2:35 PM EDT by Telephone Encounter at home and verified that I am speaking with the correct person using two identifiers.   I discussed the limitations, risks, security and privacy concerns of performing an evaluation and management service by telephone and the availability of in person appointments. I also discussed with the patient that there may be a patient responsible charge related to this service. The patient expressed understanding and agreed to proceed. Subjective:  Yvette Patel is a 43 y.o. [id number] at [redacted]w[redacted]d being seen today for ongoing prenatal care.  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  Patient reports no complaints.  Contractions: Irregular. Vag. Bleeding: None.  Movement: Present. Denies any leaking of fluid.   The following portions of the patient's history were reviewed and updated as appropriate: allergies, current medications, past family history, past medical history, past social history, past surgical history and problem list.   Objective:  There were no vitals filed for this visit.  Fetal Status:     Movement: Present     General:  Alert, oriented and cooperative. Patient is in no acute distress.  Respiratory: Normal respiratory effort, no problems with respiration noted  Mental Status: Normal mood and affect. Normal  behavior. Normal judgment and thought content.  Rest of physical exam deferred due to type of encounter    Assessment and Plan:  Pregnancy: [id number] at [redacted]w[redacted]d 1. Diabetes mellitus affecting pregnancy in third trimester CBGs ok on current Levemir and Novolog, unless she eats in the night, then fastings are up. After lunch 160 once, when she didn't take her Levemir. Has c-section scheduled Fetal movement/labor precautions reviewed, will continue weekly BPP with MFM. - [place] MFM FETAL BPP WO NON STRESS; Future  2. Supervision of high risk pregnancy, antepartum - [place] MFM FETAL BPP WO NON STRESS; Future    Term labor symptoms and general obstetric precautions including but not limited to vaginal bleeding, contractions, leaking of fluid and fetal movement were reviewed in detail with the patient. I discussed the assessment and treatment plan with the patient. The patient was provided an opportunity to ask questions and all were answered. The patient agreed with the plan and demonstrated an understanding of the instructions. The patient was advised to call back or seek an in-person office evaluation/go to MAU at [facility] for any urgent or concerning symptoms. Please refer to After Visit Summary for other counseling recommendations.   I provided 17 minutes of face-to-face time during this encounter.  Return in about 4 weeks (around 02/26/2019) for pp check.  Future Appointments  Date Time Provider Department Center  01/30/2019  1:45 PM WH-MFC [place] 5 WH-MFCUS MFC-US  02/03/2019  8:40 AM MC-MAU 1 MC-INDC None  02/06/2019 10:35 AM Allie Bossier, MD WOC-WOCA WOC    Reva Bores, MD Center for [facility], [facility]

## 2019-01-29 NOTE — Progress Notes (Signed)
I connected with  Yvette Patel on 01/29/19 at  2:35 PM EDT by telephone and verified that I am speaking with the correct person using two identifiers.   I discussed the limitations, risks, security and privacy concerns of performing an evaluation and management service by telephone and the availability of in person appointments. I also discussed with the patient that there may be a patient responsible charge related to this service. The patient expressed understanding and agreed to proceed.  She is active in babyscripts for blood pressure and cbg's . She has not done bp yet today and is at work and can't do it at work. CBG's are:    Linda,RN 01/29/2019  2:00 PM

## 2019-01-29 NOTE — Patient Instructions (Signed)

## 2019-01-30 ENCOUNTER — Encounter (HOSPITAL_COMMUNITY): Payer: Self-pay

## 2019-01-30 ENCOUNTER — Ambulatory Visit (HOSPITAL_COMMUNITY): Payer: BLUE CROSS/BLUE SHIELD | Admitting: *Deleted

## 2019-01-30 ENCOUNTER — Ambulatory Visit (HOSPITAL_COMMUNITY)
Admission: RE | Admit: 2019-01-30 | Discharge: 2019-01-30 | Disposition: A | Discharge status: Home / Self Care | Payer: BLUE CROSS/BLUE SHIELD | Source: Ambulatory Visit | Attending: Family Medicine | Admitting: Family Medicine

## 2019-01-30 VITALS — BP 99/57 | HR 103 | Temp 97.8°F

## 2019-01-30 DIAGNOSIS — O0990 Supervision of high risk pregnancy, unspecified, unspecified trimester: Secondary | ICD-10-CM

## 2019-01-30 DIAGNOSIS — O24913 Unspecified diabetes mellitus in pregnancy, third trimester: Secondary | ICD-10-CM | POA: Diagnosis present

## 2019-01-30 DIAGNOSIS — O34219 Maternal care for unspecified type scar from previous cesarean delivery: Secondary | ICD-10-CM

## 2019-01-30 DIAGNOSIS — O24113 Pre-existing diabetes mellitus, type 2, in pregnancy, third trimester: Secondary | ICD-10-CM

## 2019-01-30 DIAGNOSIS — O24119 Pre-existing diabetes mellitus, type 2, in pregnancy, unspecified trimester: Secondary | ICD-10-CM | POA: Diagnosis present

## 2019-01-30 DIAGNOSIS — O24919 Unspecified diabetes mellitus in pregnancy, unspecified trimester: Secondary | ICD-10-CM | POA: Insufficient documentation

## 2019-01-30 DIAGNOSIS — O99820 Streptococcus B carrier state complicating pregnancy: Secondary | ICD-10-CM | POA: Insufficient documentation

## 2019-01-30 DIAGNOSIS — Z3A38 38 weeks gestation of pregnancy: Secondary | ICD-10-CM

## 2019-01-30 DIAGNOSIS — I10 Essential (primary) hypertension: Secondary | ICD-10-CM

## 2019-01-30 DIAGNOSIS — O10013 Pre-existing essential hypertension complicating pregnancy, third trimester: Secondary | ICD-10-CM

## 2019-01-30 DIAGNOSIS — O09523 Supervision of elderly multigravida, third trimester: Secondary | ICD-10-CM | POA: Diagnosis not present

## 2019-01-30 DIAGNOSIS — O99210 Obesity complicating pregnancy, unspecified trimester: Secondary | ICD-10-CM | POA: Insufficient documentation

## 2019-02-03 ENCOUNTER — Other Ambulatory Visit (HOSPITAL_COMMUNITY)
Admission: RE | Admit: 2019-02-03 | Discharge: 2019-02-03 | Disposition: A | Discharge status: Home / Self Care | Payer: BLUE CROSS/BLUE SHIELD | Source: Ambulatory Visit | Attending: Obstetrics & Gynecology | Admitting: Obstetrics & Gynecology

## 2019-02-03 ENCOUNTER — Other Ambulatory Visit: Payer: Self-pay

## 2019-02-03 DIAGNOSIS — Z1159 Encounter for screening for other viral diseases: Secondary | ICD-10-CM | POA: Insufficient documentation

## 2019-02-03 NOTE — MAU Note (Signed)
Covid swab collected. Pt tolerated well. Pt asymptomatic 

## 2019-02-04 LAB — NOVEL CORONAVIRUS, NAA (HOSP ORDER, SEND-OUT TO REF LAB; TAT 18-24 HRS): SARS-CoV-2, NAA: NOT DETECTED

## 2019-02-05 ENCOUNTER — Inpatient Hospital Stay (HOSPITAL_COMMUNITY): Payer: BLUE CROSS/BLUE SHIELD | Admitting: Anesthesiology

## 2019-02-05 ENCOUNTER — Other Ambulatory Visit: Payer: Self-pay

## 2019-02-05 ENCOUNTER — Encounter (HOSPITAL_COMMUNITY): Payer: Self-pay | Admitting: *Deleted

## 2019-02-05 ENCOUNTER — Inpatient Hospital Stay (HOSPITAL_COMMUNITY)
Admission: RE | Admit: 2019-02-05 | Discharge: 2019-02-08 | DRG: 786 | Disposition: A | Discharge status: Home / Self Care | Payer: BLUE CROSS/BLUE SHIELD | Attending: Obstetrics and Gynecology | Admitting: Obstetrics and Gynecology

## 2019-02-05 ENCOUNTER — Encounter (HOSPITAL_COMMUNITY)
Admission: RE | Disposition: A | Discharge status: Home / Self Care | Payer: Self-pay | Source: Home / Self Care | Attending: Obstetrics and Gynecology

## 2019-02-05 DIAGNOSIS — O99210 Obesity complicating pregnancy, unspecified trimester: Secondary | ICD-10-CM | POA: Diagnosis present

## 2019-02-05 DIAGNOSIS — Z8751 Personal history of pre-term labor: Secondary | ICD-10-CM

## 2019-02-05 DIAGNOSIS — O0990 Supervision of high risk pregnancy, unspecified, unspecified trimester: Secondary | ICD-10-CM

## 2019-02-05 DIAGNOSIS — E1139 Type 2 diabetes mellitus with other diabetic ophthalmic complication: Secondary | ICD-10-CM | POA: Diagnosis present

## 2019-02-05 DIAGNOSIS — O1002 Pre-existing essential hypertension complicating childbirth: Secondary | ICD-10-CM | POA: Diagnosis present

## 2019-02-05 DIAGNOSIS — O34219 Maternal care for unspecified type scar from previous cesarean delivery: Secondary | ICD-10-CM | POA: Diagnosis present

## 2019-02-05 DIAGNOSIS — O34211 Maternal care for low transverse scar from previous cesarean delivery: Principal | ICD-10-CM | POA: Diagnosis present

## 2019-02-05 DIAGNOSIS — E11319 Type 2 diabetes mellitus with unspecified diabetic retinopathy without macular edema: Secondary | ICD-10-CM | POA: Diagnosis not present

## 2019-02-05 DIAGNOSIS — Z880 Allergy status to penicillin: Secondary | ICD-10-CM | POA: Diagnosis not present

## 2019-02-05 DIAGNOSIS — O99824 Streptococcus B carrier state complicating childbirth: Secondary | ICD-10-CM | POA: Diagnosis present

## 2019-02-05 DIAGNOSIS — Z87891 Personal history of nicotine dependence: Secondary | ICD-10-CM | POA: Diagnosis not present

## 2019-02-05 DIAGNOSIS — Z98891 History of uterine scar from previous surgery: Secondary | ICD-10-CM

## 2019-02-05 DIAGNOSIS — Z3A39 39 weeks gestation of pregnancy: Secondary | ICD-10-CM | POA: Diagnosis not present

## 2019-02-05 DIAGNOSIS — O09529 Supervision of elderly multigravida, unspecified trimester: Secondary | ICD-10-CM

## 2019-02-05 DIAGNOSIS — O24919 Unspecified diabetes mellitus in pregnancy, unspecified trimester: Secondary | ICD-10-CM | POA: Diagnosis present

## 2019-02-05 DIAGNOSIS — Z794 Long term (current) use of insulin: Secondary | ICD-10-CM

## 2019-02-05 DIAGNOSIS — O2412 Pre-existing diabetes mellitus, type 2, in childbirth: Secondary | ICD-10-CM | POA: Diagnosis present

## 2019-02-05 DIAGNOSIS — I10 Essential (primary) hypertension: Secondary | ICD-10-CM | POA: Diagnosis present

## 2019-02-05 HISTORY — DX: Other reaction to spinal and lumbar puncture: G97.1

## 2019-02-05 LAB — TYPE AND SCREEN
ABO/RH(D): A POS
Antibody Screen: NEGATIVE

## 2019-02-05 LAB — CBC
HCT: 34.5 % — ABNORMAL LOW (ref 36.0–46.0)
Hemoglobin: 11.3 g/dL — ABNORMAL LOW (ref 12.0–15.0)
MCH: 28.1 pg (ref 26.0–34.0)
MCHC: 32.8 g/dL (ref 30.0–36.0)
MCV: 85.8 fL (ref 80.0–100.0)
Platelets: 192 10*3/uL (ref 150–400)
RBC: 4.02 MIL/uL (ref 3.87–5.11)
RDW: 15.2 % (ref 11.5–15.5)
WBC: 9.8 10*3/uL (ref 4.0–10.5)
nRBC: 0 % (ref 0.0–0.2)

## 2019-02-05 LAB — GLUCOSE, CAPILLARY
Glucose-Capillary: 83 mg/dL (ref 70–99)
Glucose-Capillary: 82 mg/dL (ref 70–99)

## 2019-02-05 LAB — RPR: RPR Ser Ql: NONREACTIVE

## 2019-02-05 SURGERY — Surgical Case
Anesthesia: Spinal | Site: Abdomen | Wound class: Clean Contaminated

## 2019-02-05 MED ORDER — OXYCODONE HCL 5 MG PO TABS: 5.0000 mg | ORAL_TABLET | Freq: Once | ORAL | Status: DC | PRN

## 2019-02-05 MED ORDER — PHENYLEPHRINE HCL-NACL 20-0.9 MG/250ML-% IV SOLN
INTRAVENOUS | Status: AC
  Filled 2019-02-05: qty 250

## 2019-02-05 MED ORDER — HYDROMORPHONE HCL 1 MG/ML IJ SOLN
0.2500 mg | INTRAMUSCULAR | Status: DC | PRN
  Administered 2019-02-05: 0.5 mg via INTRAVENOUS

## 2019-02-05 MED ORDER — FENTANYL CITRATE (PF) 100 MCG/2ML IJ SOLN
INTRAMUSCULAR | Status: AC
  Filled 2019-02-05: qty 2

## 2019-02-05 MED ORDER — SCOPOLAMINE 1 MG/3DAYS TD PT72
MEDICATED_PATCH | TRANSDERMAL | Status: DC | PRN
  Administered 2019-02-05: 1 via TRANSDERMAL

## 2019-02-05 MED ORDER — ONDANSETRON HCL 4 MG/2ML IJ SOLN
4.0000 mg | Freq: Three times a day (TID) | INTRAMUSCULAR | Status: DC | PRN
  Administered 2019-02-05: 4 mg via INTRAVENOUS
  Filled 2019-02-05: qty 2

## 2019-02-05 MED ORDER — PRENATAL MULTIVITAMIN CH
1.0000 | ORAL_TABLET | Freq: Every day | ORAL | Status: DC
  Administered 2019-02-06 – 2019-02-07 (×2): 1 via ORAL
  Filled 2019-02-05 (×2): qty 1

## 2019-02-05 MED ORDER — METFORMIN HCL ER 500 MG PO TB24
1000.0000 mg | ORAL_TABLET | Freq: Two times a day (BID) | ORAL | Status: DC
  Administered 2019-02-05 – 2019-02-07 (×4): 1000 mg via ORAL
  Filled 2019-02-05 (×5): qty 2

## 2019-02-05 MED ORDER — LACTATED RINGERS IV SOLN
INTRAVENOUS | Status: DC | PRN
  Administered 2019-02-05 (×2): via INTRAVENOUS

## 2019-02-05 MED ORDER — HYDROMORPHONE HCL 1 MG/ML IJ SOLN
INTRAMUSCULAR | Status: DC | PRN
  Administered 2019-02-05 (×2): 1 mg via INTRAVENOUS

## 2019-02-05 MED ORDER — OXYTOCIN 40 UNITS IN NORMAL SALINE INFUSION - SIMPLE MED
INTRAVENOUS | Status: AC
  Filled 2019-02-05: qty 1000

## 2019-02-05 MED ORDER — ACETAMINOPHEN 325 MG PO TABS
650.0000 mg | ORAL_TABLET | ORAL | Status: DC | PRN
  Administered 2019-02-05 – 2019-02-08 (×12): 650 mg via ORAL
  Filled 2019-02-05 (×13): qty 2

## 2019-02-05 MED ORDER — SENNOSIDES-DOCUSATE SODIUM 8.6-50 MG PO TABS
2.0000 | ORAL_TABLET | ORAL | Status: DC
  Administered 2019-02-05 – 2019-02-07 (×3): 2 via ORAL
  Filled 2019-02-05 (×3): qty 2

## 2019-02-05 MED ORDER — MENTHOL 3 MG MT LOZG: 1.0000 | LOZENGE | OROMUCOSAL | Status: DC | PRN

## 2019-02-05 MED ORDER — MEPERIDINE HCL 25 MG/ML IJ SOLN
INTRAMUSCULAR | Status: DC | PRN
  Administered 2019-02-05: 25 mg via INTRAVENOUS

## 2019-02-05 MED ORDER — ACETAMINOPHEN 10 MG/ML IV SOLN
INTRAVENOUS | Status: AC
  Filled 2019-02-05: qty 100

## 2019-02-05 MED ORDER — NALOXONE HCL 0.4 MG/ML IJ SOLN: 0.4000 mg | INTRAMUSCULAR | Status: DC | PRN

## 2019-02-05 MED ORDER — FAMOTIDINE 20 MG PO TABS
20.0000 mg | ORAL_TABLET | Freq: Once | ORAL | Status: AC
  Administered 2019-02-05: 20 mg via ORAL

## 2019-02-05 MED ORDER — NALBUPHINE HCL 10 MG/ML IJ SOLN: 5.0000 mg | Freq: Once | INTRAMUSCULAR | Status: DC | PRN

## 2019-02-05 MED ORDER — SODIUM CHLORIDE 0.9% FLUSH: 3.0000 mL | INTRAVENOUS | Status: DC | PRN

## 2019-02-05 MED ORDER — SODIUM CHLORIDE 0.9 % IV SOLN
INTRAVENOUS | Status: DC | PRN
  Administered 2019-02-05: 11:00:00 via INTRAVENOUS

## 2019-02-05 MED ORDER — BUPIVACAINE IN DEXTROSE 0.75-8.25 % IT SOLN
INTRATHECAL | Status: DC | PRN
  Administered 2019-02-05: 1.7 mL via INTRATHECAL

## 2019-02-05 MED ORDER — KETAMINE HCL 10 MG/ML IJ SOLN
INTRAMUSCULAR | Status: DC | PRN
  Administered 2019-02-05 (×2): 20 mg via INTRAVENOUS
  Administered 2019-02-05: 10 mg via INTRAVENOUS

## 2019-02-05 MED ORDER — OXYTOCIN 40 UNITS IN NORMAL SALINE INFUSION - SIMPLE MED: 2.5000 [IU]/h | INTRAVENOUS | Status: AC

## 2019-02-05 MED ORDER — NALBUPHINE HCL 10 MG/ML IJ SOLN: 5.0000 mg | INTRAMUSCULAR | Status: DC | PRN

## 2019-02-05 MED ORDER — KETOROLAC TROMETHAMINE 30 MG/ML IJ SOLN
30.0000 mg | Freq: Once | INTRAMUSCULAR | Status: AC
  Administered 2019-02-05: 30 mg via INTRAVENOUS

## 2019-02-05 MED ORDER — SIMETHICONE 80 MG PO CHEW: 80.0000 mg | CHEWABLE_TABLET | ORAL | Status: DC | PRN

## 2019-02-05 MED ORDER — LACTATED RINGERS IV SOLN
INTRAVENOUS | Status: DC
  Administered 2019-02-05: via INTRAVENOUS

## 2019-02-05 MED ORDER — ZOLPIDEM TARTRATE 5 MG PO TABS: 5.0000 mg | ORAL_TABLET | Freq: Every evening | ORAL | Status: DC | PRN

## 2019-02-05 MED ORDER — IBUPROFEN 800 MG PO TABS
800.0000 mg | ORAL_TABLET | Freq: Three times a day (TID) | ORAL | Status: DC | PRN
  Administered 2019-02-05 – 2019-02-07 (×7): 800 mg via ORAL
  Filled 2019-02-05 (×7): qty 1

## 2019-02-05 MED ORDER — HYDROMORPHONE HCL 1 MG/ML IJ SOLN
INTRAMUSCULAR | Status: AC
  Filled 2019-02-05: qty 1

## 2019-02-05 MED ORDER — TETANUS-DIPHTH-ACELL PERTUSSIS 5-2.5-18.5 LF-MCG/0.5 IM SUSP: 0.5000 mL | Freq: Once | INTRAMUSCULAR | Status: DC

## 2019-02-05 MED ORDER — SODIUM CHLORIDE 0.9 % IR SOLN
Status: DC | PRN
  Administered 2019-02-05: 1

## 2019-02-05 MED ORDER — DIBUCAINE (PERIANAL) 1 % EX OINT: 1.0000 "application " | TOPICAL_OINTMENT | CUTANEOUS | Status: DC | PRN

## 2019-02-05 MED ORDER — DIPHENHYDRAMINE HCL 50 MG/ML IJ SOLN: 12.5000 mg | Freq: Four times a day (QID) | INTRAMUSCULAR | Status: DC | PRN

## 2019-02-05 MED ORDER — NALOXONE HCL 4 MG/10ML IJ SOLN
1.0000 ug/kg/h | INTRAVENOUS | Status: DC | PRN
  Filled 2019-02-05: qty 5

## 2019-02-05 MED ORDER — HYDROMORPHONE HCL 1 MG/ML IJ SOLN
INTRAMUSCULAR | Status: AC
  Filled 2019-02-05: qty 0.5

## 2019-02-05 MED ORDER — MEPERIDINE HCL 25 MG/ML IJ SOLN
INTRAMUSCULAR | Status: AC
  Filled 2019-02-05: qty 1

## 2019-02-05 MED ORDER — CEFAZOLIN SODIUM-DEXTROSE 2-4 GM/100ML-% IV SOLN
INTRAVENOUS | Status: AC
  Filled 2019-02-05: qty 100

## 2019-02-05 MED ORDER — OXYCODONE HCL 5 MG PO TABS
5.0000 mg | ORAL_TABLET | ORAL | Status: DC | PRN
  Administered 2019-02-05: 5 mg via ORAL
  Administered 2019-02-06 (×2): 10 mg via ORAL
  Administered 2019-02-06: 5 mg via ORAL
  Administered 2019-02-06 – 2019-02-08 (×9): 10 mg via ORAL
  Filled 2019-02-05 (×7): qty 2
  Filled 2019-02-05: qty 1
  Filled 2019-02-05 (×2): qty 2
  Filled 2019-02-05: qty 1
  Filled 2019-02-05 (×3): qty 2

## 2019-02-05 MED ORDER — KETAMINE HCL 50 MG/5ML IJ SOSY
PREFILLED_SYRINGE | INTRAMUSCULAR | Status: AC
  Filled 2019-02-05: qty 5

## 2019-02-05 MED ORDER — ACETAMINOPHEN 10 MG/ML IV SOLN
1000.0000 mg | Freq: Once | INTRAVENOUS | Status: DC | PRN
  Administered 2019-02-05: 1000 mg via INTRAVENOUS

## 2019-02-05 MED ORDER — COCONUT OIL OIL: 1.0000 "application " | TOPICAL_OIL | Status: DC | PRN

## 2019-02-05 MED ORDER — AMLODIPINE BESYLATE 5 MG PO TABS
10.0000 mg | ORAL_TABLET | Freq: Every day | ORAL | Status: DC
  Administered 2019-02-05 – 2019-02-08 (×4): 10 mg via ORAL
  Filled 2019-02-05 (×4): qty 2

## 2019-02-05 MED ORDER — DIPHENHYDRAMINE HCL 25 MG PO CAPS: 25.0000 mg | ORAL_CAPSULE | ORAL | Status: DC | PRN

## 2019-02-05 MED ORDER — DIPHENHYDRAMINE HCL 25 MG PO CAPS: 25.0000 mg | ORAL_CAPSULE | Freq: Four times a day (QID) | ORAL | Status: DC | PRN

## 2019-02-05 MED ORDER — FENTANYL CITRATE (PF) 100 MCG/2ML IJ SOLN
INTRAMUSCULAR | Status: DC | PRN
  Administered 2019-02-05: 15 ug via INTRATHECAL
  Administered 2019-02-05: 100 ug via INTRAVENOUS
  Administered 2019-02-05: 85 ug via INTRAVENOUS

## 2019-02-05 MED ORDER — ENOXAPARIN SODIUM 40 MG/0.4ML ~~LOC~~ SOLN
40.0000 mg | SUBCUTANEOUS | Status: DC
  Administered 2019-02-06 – 2019-02-07 (×2): 40 mg via SUBCUTANEOUS
  Filled 2019-02-05 (×2): qty 0.4

## 2019-02-05 MED ORDER — STERILE WATER FOR IRRIGATION IR SOLN
Status: DC | PRN
  Administered 2019-02-05: 1

## 2019-02-05 MED ORDER — SIMETHICONE 80 MG PO CHEW
80.0000 mg | CHEWABLE_TABLET | Freq: Three times a day (TID) | ORAL | Status: DC
  Administered 2019-02-05 – 2019-02-08 (×8): 80 mg via ORAL
  Filled 2019-02-05 (×8): qty 1

## 2019-02-05 MED ORDER — WITCH HAZEL-GLYCERIN EX PADS: 1.0000 "application " | MEDICATED_PAD | CUTANEOUS | Status: DC | PRN

## 2019-02-05 MED ORDER — CEFAZOLIN SODIUM-DEXTROSE 2-4 GM/100ML-% IV SOLN
2.0000 g | INTRAVENOUS | Status: AC
  Administered 2019-02-05: 10:00:00 2 g via INTRAVENOUS

## 2019-02-05 MED ORDER — PHENYLEPHRINE HCL-NACL 20-0.9 MG/250ML-% IV SOLN
INTRAVENOUS | Status: DC | PRN
  Administered 2019-02-05: 60 ug/min via INTRAVENOUS

## 2019-02-05 MED ORDER — CHLOROPROCAINE HCL (PF) 3 % IJ SOLN
INTRAMUSCULAR | Status: AC
  Filled 2019-02-05: qty 20

## 2019-02-05 MED ORDER — SODIUM BICARBONATE 8.4 % IV SOLN
INTRAVENOUS | Status: DC | PRN
  Administered 2019-02-05: 20 mL via EPIDURAL

## 2019-02-05 MED ORDER — ONDANSETRON HCL 4 MG/2ML IJ SOLN
INTRAMUSCULAR | Status: AC
  Filled 2019-02-05: qty 2

## 2019-02-05 MED ORDER — OXYCODONE HCL 5 MG/5ML PO SOLN: 5.0000 mg | Freq: Once | ORAL | Status: DC | PRN

## 2019-02-05 MED ORDER — KETOROLAC TROMETHAMINE 30 MG/ML IJ SOLN
INTRAMUSCULAR | Status: AC
  Filled 2019-02-05: qty 1

## 2019-02-05 MED ORDER — SCOPOLAMINE 1 MG/3DAYS TD PT72
1.0000 | MEDICATED_PATCH | Freq: Once | TRANSDERMAL | Status: DC
  Filled 2019-02-05: qty 1

## 2019-02-05 MED ORDER — ONDANSETRON HCL 4 MG/2ML IJ SOLN
INTRAMUSCULAR | Status: DC | PRN
  Administered 2019-02-05: 4 mg via INTRAVENOUS

## 2019-02-05 MED ORDER — PROMETHAZINE HCL 25 MG/ML IJ SOLN: 6.2500 mg | INTRAMUSCULAR | Status: DC | PRN

## 2019-02-05 MED ORDER — SCOPOLAMINE 1 MG/3DAYS TD PT72
MEDICATED_PATCH | TRANSDERMAL | Status: AC
  Filled 2019-02-05: qty 1

## 2019-02-05 MED ORDER — FAMOTIDINE 20 MG PO TABS
ORAL_TABLET | ORAL | Status: AC
  Filled 2019-02-05: qty 1

## 2019-02-05 MED ORDER — MORPHINE SULFATE (PF) 0.5 MG/ML IJ SOLN
INTRAMUSCULAR | Status: DC | PRN
  Administered 2019-02-05: .15 mg via INTRATHECAL

## 2019-02-05 MED ORDER — SODIUM CHLORIDE 0.9 % IV SOLN
INTRAVENOUS | Status: DC | PRN
  Administered 2019-02-05: 11:00:00 40 [IU] via INTRAVENOUS

## 2019-02-05 MED ORDER — SIMETHICONE 80 MG PO CHEW
80.0000 mg | CHEWABLE_TABLET | ORAL | Status: DC
  Administered 2019-02-05 – 2019-02-07 (×3): 80 mg via ORAL
  Filled 2019-02-05 (×3): qty 1

## 2019-02-05 MED ORDER — MORPHINE SULFATE (PF) 0.5 MG/ML IJ SOLN
INTRAMUSCULAR | Status: AC
  Filled 2019-02-05: qty 10

## 2019-02-05 SURGICAL SUPPLY — 35 items
APL SKNCLS STERI-STRIP NONHPOA (GAUZE/BANDAGES/DRESSINGS) ×1
BENZOIN TINCTURE PRP APPL 2/3 (GAUZE/BANDAGES/DRESSINGS) ×2 IMPLANT
CHLORAPREP W/TINT 26ML (MISCELLANEOUS) ×2 IMPLANT
CLAMP CORD UMBIL (MISCELLANEOUS) IMPLANT
CLOTH BEACON ORANGE TIMEOUT ST (SAFETY) ×2 IMPLANT
DRSG OPSITE POSTOP 4X10 (GAUZE/BANDAGES/DRESSINGS) ×2 IMPLANT
ELECT REM PT RETURN 9FT ADLT (ELECTROSURGICAL) ×2
ELECTRODE REM PT RTRN 9FT ADLT (ELECTROSURGICAL) ×1 IMPLANT
EXTRACTOR VACUUM M CUP 4 TUBE (SUCTIONS) IMPLANT
GLOVE BIOGEL PI IND STRL 7.0 (GLOVE) ×2 IMPLANT
GLOVE BIOGEL PI IND STRL 7.5 (GLOVE) ×2 IMPLANT
GLOVE BIOGEL PI INDICATOR 7.0 (GLOVE) ×2
GLOVE BIOGEL PI INDICATOR 7.5 (GLOVE) ×2
GLOVE ECLIPSE 7.5 STRL STRAW (GLOVE) ×2 IMPLANT
GOWN STRL REUS W/TWL LRG LVL3 (GOWN DISPOSABLE) ×6 IMPLANT
KIT ABG SYR 3ML LUER SLIP (SYRINGE) IMPLANT
NDL HYPO 25X5/8 SAFETYGLIDE (NEEDLE) IMPLANT
NEEDLE HYPO 25X5/8 SAFETYGLIDE (NEEDLE) IMPLANT
NS IRRIG 1000ML POUR BTL (IV SOLUTION) ×2 IMPLANT
PACK C SECTION WH (CUSTOM PROCEDURE TRAY) ×2 IMPLANT
PAD ABD 7.5X8 STRL (GAUZE/BANDAGES/DRESSINGS) ×1 IMPLANT
PAD OB MATERNITY 4.3X12.25 (PERSONAL CARE ITEMS) ×2 IMPLANT
PENCIL SMOKE EVAC W/HOLSTER (ELECTROSURGICAL) ×2 IMPLANT
RTRCTR C-SECT PINK 25CM LRG (MISCELLANEOUS) ×2 IMPLANT
SPONGE GAUZE 4X4 12PLY STER LF (GAUZE/BANDAGES/DRESSINGS) ×2 IMPLANT
STRIP CLOSURE SKIN 1/2X4 (GAUZE/BANDAGES/DRESSINGS) ×2 IMPLANT
SUT VIC AB 0 CT1 36 (SUTURE) ×2 IMPLANT
SUT VIC AB 0 CTX 36 (SUTURE) ×8
SUT VIC AB 0 CTX36XBRD ANBCTRL (SUTURE) ×2 IMPLANT
SUT VIC AB 2-0 CT1 27 (SUTURE) ×2
SUT VIC AB 2-0 CT1 TAPERPNT 27 (SUTURE) ×1 IMPLANT
SUT VIC AB 4-0 KS 27 (SUTURE) ×2 IMPLANT
TOWEL OR 17X24 6PK STRL BLUE (TOWEL DISPOSABLE) ×2 IMPLANT
TRAY FOLEY W/BAG SLVR 14FR LF (SET/KITS/TRAYS/PACK) ×2 IMPLANT
WATER STERILE IRR 1000ML POUR (IV SOLUTION) ×2 IMPLANT

## 2019-02-05 NOTE — Anesthesia Preprocedure Evaluation (Addendum)
Anesthesia Evaluation  Patient identified by MRN, date of birth, ID band Patient awake    Reviewed: Allergy & Precautions, NPO status , Patient's Chart, lab work & pertinent test results  History of Anesthesia Complications (+) POST - OP SPINAL HEADACHE and history of anesthetic complications  Airway Mallampati: II  TM Distance: >3 FB Neck ROM: Full    Dental no notable dental hx. (+) Poor Dentition, Missing   Pulmonary former smoker,    Pulmonary exam normal breath sounds clear to auscultation       Cardiovascular hypertension, Pt. on medications Normal cardiovascular exam Rhythm:Regular Rate:Normal     Neuro/Psych  Headaches, negative psych ROS   GI/Hepatic negative GI ROS, Neg liver ROS,   Endo/Other  diabetes, Insulin Dependent, Oral Hypoglycemic Agents  Renal/GU negative Renal ROS     Musculoskeletal negative musculoskeletal ROS (+)   Abdominal (+) + obese,   Peds  Hematology negative hematology ROS (+)   Anesthesia Other Findings RCS  Reproductive/Obstetrics (+) Pregnancy                            Anesthesia Physical Anesthesia Plan  ASA: III  Anesthesia Plan: Spinal   Post-op Pain Management:    Induction:   PONV Risk Score and Plan: 2 and Ondansetron, Dexamethasone and Treatment may vary due to age or medical condition  Airway Management Planned: Simple Face Mask  Additional Equipment:   Intra-op Plan:   Post-operative Plan:   Informed Consent: I have reviewed the patients History and Physical, chart, labs and discussed the procedure including the risks, benefits and alternatives for the proposed anesthesia with the patient or authorized representative who has indicated his/her understanding and acceptance.     Dental advisory given  Plan Discussed with: CRNA  Anesthesia Plan Comments:        Anesthesia Quick Evaluation

## 2019-02-05 NOTE — Lactation Note (Signed)
This note was copied from a baby's chart. Lactation Consultation Note  Patient Name: Yvette Patel [id number] Date: 02/05/2019 Reason for consult: Initial assessment;Term;Infant < 6lbs  P4 mother whose infant is now 42 hours old.  This is a term infant weighing < 6 lbs.  Mother is a diabetic.  Baby is currently in the nursery for observation for decreased temperature and hypoglycemia.    Mother has breast feeding experience but her other three children are now 42 years, 42 years and 42 years old.    Discussed breast feeding basics with mother.  Upon assessment her breasts are large, soft and non tender and nipples are short shafted and intact.  Breast tissue is compressible.  Offered to initiate the DEBP and mother willingly agreed.  Pump parts, assembly, disassembly and cleaning reviewed.  Encouraged mother to pump every three hours and, when baby returns to her room, to put baby to breast first before pumping.  Encouraged to feed 8-12 times in 24 hours or sooner if baby shows feeding cues.  Reviewed feeding cues with mother.  Practiced hand expression but mother was unable to obtain any colostrum drops at this time.  Colostrum container provided and milk storage times reviewed.  Finger feeding demonstrated.  Mother will do hand expression before/after feedings to help increase milk supply.  She will save any drops and finger feed drops back to baby.  MD arrived in room while I was visiting with mother to provide an update on baby [place].  Due to her continued decreased temperature outside of the warmer and low blood sugars she will be monitored for a little longer in the nursery.  Per MD, she is not feeding well at this time.  They will continue to monitor and update mother.  Mother verbalized understanding of current plan.  Praised her efforts to begin pumping.  Mother plans to stay home for a year with this child before considering returning to work.  She has private insurance but was unaware that  she may be able to obtain a DEBP from her insurance company.  Suggested she call to find out how she can obtain a pump.  Mom made aware of O/P services, breastfeeding support groups, community resources, and our phone # for post-discharge questions. Mom made aware of O/P services, breastfeeding support groups, community resources, and our phone # for post-discharge questions.  Father is not present now but mother stated he will return later tonight.  Encouraged her to call her RN for any further questions/concerns.  RN updated.   Maternal Data Formula Feeding for Exclusion: No Has patient been taught Hand Expression?: Yes Does the patient have breastfeeding experience prior to this delivery?: Yes  Feeding Feeding Type: Bottle Fed - Formula Nipple Type: Slow - flow  LATCH Score                   Interventions    Lactation Tools Discussed/Used WIC Program: No Pump Review: Setup, frequency, and cleaning;Milk Storage Initiated by:: Niki Cosman Date initiated:: 02/06/19   Consult Status Consult Status: Follow-up Date: 02/06/19 Follow-up type: In-patient    Little Ishikawa 02/05/2019, 5:58 PM

## 2019-02-05 NOTE — Discharge Summary (Signed)
OB Discharge Summary     Patient Name: Yvette Patel DOB: 02/24/1976 MRN: [id number]  Date of admission: 02/05/2019 Delivering MD: Laurey Arrow BEDFORD   Date of discharge: 02/08/2019  Admitting diagnosis: RCS Intrauterine pregnancy: [redacted]w[redacted]d     Secondary diagnosis:  Active Problems:   Diabetic retinopathy ([place])   Vitreous hemorrhage of right eye due to diabetes mellitus ([place])   Previous cesarean delivery, antepartum   History of preterm delivery   Diabetes mellitus complicating pregnancy   Chronic hypertension   AMA (advanced maternal age) multigravida 35+   Obesity in pregnancy   Status post repeat low transverse cesarean section  Additional problems: elevated creatinine      Discharge diagnosis: Term Pregnancy Delivered, CHTN and Type 2 DM                                                                                                Post partum procedures:none  Augmentation: N/A  Complications: None  Hospital course:  Scheduled C/S   43 y.o. yo [id number] at [redacted]w[redacted]d was admitted to the hospital 02/05/2019 for scheduled cesarean section with the following indication:Elective Repeat.  Membrane Rupture Time/Date: 10:45 AM ,02/05/2019   Patient delivered a Viable infant.02/05/2019  Details of operation can be found in separate operative note.  Patient had a postpartum course complicated by needing amlodipine 10mg  for BP control; she also had an elevated creatinine trending from 0.93>1.15>1.06 during her stay. Her metformin 1000bid was held due to her creatinine and she was discharged on Levemir 10u.  She is ambulating, tolerating a regular diet, passing flatus, and urinating well. Patient is discharged home in stable condition on  02/08/19.         Physical exam  Vitals:   02/07/19 0533 02/07/19 1425 02/07/19 2252 02/08/19 0544  BP: (!) 146/78 125/72 (!) 148/70 125/62  Pulse: 96 86 98 93  Resp: 18 18 18 16   Temp: 98.2 F (36.8 C) 98.3 F (36.8 C) 98 F (36.7 C) 98.3 F (36.8 C)   TempSrc: Oral Oral  Oral  SpO2: 98% 99% 98%   Weight:      Height:       General: alert and cooperative Lochia: appropriate Uterine Fundus: firm Incision: honeycomb mostly saturated; will be changed prior to d/c DVT Evaluation: No evidence of DVT seen on physical exam. Labs: Lab Results  Component Value Date   WBC 9.5 02/08/2019   HGB 9.3 (L) 02/08/2019   HCT 28.9 (L) 02/08/2019   MCV 87.6 02/08/2019   PLT 182 02/08/2019   CMP Latest Ref Rng & Units 02/08/2019  Glucose 70 - 99 mg/dL 131(H)  BUN 6 - 20 mg/dL 14  Creatinine 0.44 - 1.00 mg/dL 1.06(H)  Sodium 135 - 145 mmol/L 136  Potassium 3.5 - 5.1 mmol/L 4.1  Chloride 98 - 111 mmol/L 105  CO2 22 - 32 mmol/L 23  Calcium 8.9 - 10.3 mg/dL 8.8(L)  Total Protein 6.5 - 8.1 g/dL 6.4(L)  Total Bilirubin 0.3 - 1.2 mg/dL 0.3  Alkaline Phos 38 - 126 U/L 84  AST 15 - 41 U/L 24  ALT 0 - 44 U/L 20    Discharge instruction: per After Visit Summary and "Baby and Me Booklet".  After visit meds:  Allergies as of 02/08/2019      Reactions   Chlorine Hives, Other (See Comments)   Skin peels   Mushroom Ext Cmplx(shiitake-reishi-mait) Nausea And Vomiting   Sulfa Antibiotics Other (See Comments)   Turned eyes blood shot and teared blood   Citric Acid Rash, Other (See Comments)   Skin peels   Penicillins Itching, Nausea And Vomiting   Did it involve swelling of the face/tongue/throat, SOB, or low BP? No Did it involve sudden or severe rash/hives, skin peeling, or any reaction on the inside of your mouth or nose? No Did you need to seek medical attention at a hospital or doctor's office? Yes When did it last happen?10+ years If all above answers are "NO", may proceed with cephalosporin use.      Medication List    STOP taking these medications   insulin aspart 100 UNIT/ML injection Commonly known as: novoLOG   metFORMIN 500 MG 24 hr tablet Commonly known as: GLUCOPHAGE-XR   NIFEdipine 60 MG 24 hr tablet Commonly known as:  PROCARDIA XL/NIFEDICAL XL     TAKE these medications   amLODipine 10 MG tablet Commonly known as: NORVASC Take 1 tablet (10 mg total) by mouth daily.   aspirin EC 81 MG tablet Take 1 tablet (81 mg total) by mouth daily.   Ferrous Fumarate 324 (106 Fe) MG Tabs tablet Commonly known as: HEMOCYTE - 106 mg FE Take 1 tablet (106 mg of iron total) by mouth daily.   ibuprofen 800 MG tablet Commonly known as: ADVIL Take 1 tablet (800 mg total) by mouth every 8 (eight) hours as needed for mild pain.   Levemir FlexTouch 100 UNIT/ML Pen Generic drug: Insulin Detemir Inject 10 Units into the skin at bedtime. What changed: how much to take   oxyCODONE 5 MG immediate release tablet Commonly known as: Oxy IR/ROXICODONE Take 1-2 tablets (5-10 mg total) by mouth every 4 (four) hours as needed for moderate pain.   PRENATAL VITAMIN PO Take 1 tablet by mouth daily.   vitamin C 1000 MG tablet Take 1,000 mg by mouth daily.       Diet: carb modified diet  Activity: Advance as tolerated. Pelvic rest for 6 weeks.   Outpatient follow up:1-2wks for incision and BP check, as well as DM management and f/u on creatinine Follow up Appt: Future Appointments  Date Time Provider Department Center  03/05/2019  1:15 PM Burleson, Brand Maleserri L, NP WOC-WOCA WOC   Please schedule this patient for Postpartum visit in: 4 weeks with the following provider: MD For C/S patients schedule nurse incision check in weeks 2 weeks: yes High risk pregnancy complicated by: T2DM, cHTN, AMA  Delivery mode:  CS Anticipated Birth Control:  Paragard PP Procedures needed: BP check, incision check  Schedule Integrated BH visit: no    Follow up Visit:No follow-ups on file.  Postpartum contraception: IUD Paragard  Newborn Data: Live born female  Birth Weight:  2574gm (5lb 10.8oz) APGAR: 9, 9  Newborn Delivery   Birth date/time:  02/05/2019 10:45:00 Delivery type:  C-Section, Low Transverse Trial of labor:   No C-section categorization:  Repeat     Baby Feeding: pumping Disposition:NICU   02/08/2019 Arabella MerlesKimberly D Jahron Hunsinger, CNM  9:19 AM

## 2019-02-05 NOTE — Anesthesia Postprocedure Evaluation (Signed)
Anesthesia Post Note  Patient: Yvette Patel  Procedure(s) Performed: CESAREAN SECTION (N/A Abdomen)     Patient location during evaluation: PACU Anesthesia Type: Spinal Level of consciousness: oriented and awake and alert Pain management: pain level controlled Vital Signs Assessment: post-procedure vital signs reviewed and stable Respiratory status: spontaneous breathing, respiratory function stable and patient connected to nasal cannula oxygen Cardiovascular status: blood pressure returned to baseline and stable Postop Assessment: no headache, no backache, no apparent nausea or vomiting and spinal receding Anesthetic complications: no    Last Vitals:  Vitals:   02/05/19 1307 02/05/19 1404  BP: (!) 154/65 140/75  Pulse: 91 87  Resp: 18 16  Temp: 36.5 C 36.5 C  SpO2: 99% 98%    Last Pain:  Vitals:   02/05/19 1404  TempSrc: Oral  PainSc: 3    Pain Goal: Patients Stated Pain Goal: 3 (02/05/19 1404)                 Ryan P Ellender

## 2019-02-05 NOTE — Transfer of Care (Signed)
Immediate Anesthesia Transfer of Care Note  Patient: Yvette Patel  Procedure(s) Performed: CESAREAN SECTION (N/A Abdomen)  Patient Location: PACU  Anesthesia Type:Spinal  Level of Consciousness: awake, alert  and oriented  Airway & Oxygen Therapy: Patient Spontanous Breathing  Post-op Assessment: Report given to RN and Post -op Vital signs reviewed and stable  Post vital signs: Reviewed and stable  Last Vitals:  Vitals Value Taken Time  BP 139/99 02/05/2019 11:48 AM  Temp    Pulse 96 02/05/2019 11:53 AM  Resp 15 02/05/2019 11:53 AM  SpO2 100 % 02/05/2019 11:53 AM  Vitals shown include unvalidated device data.  Last Pain:  Vitals:   02/05/19 1148  TempSrc:   PainSc: (P) 8          Complications: No apparent anesthesia complications

## 2019-02-05 NOTE — Anesthesia Procedure Notes (Signed)
Spinal  Patient location during procedure: OR Start time: 02/05/2019 10:15 AM End time: 02/05/2019 10:20 AM Staffing Anesthesiologist: Murvin Natal, MD Performed: anesthesiologist  Preanesthetic Checklist Completed: patient identified, surgical consent, pre-op evaluation, timeout performed, IV checked, risks and benefits discussed and monitors and equipment checked Spinal Block Patient position: sitting Prep: DuraPrep Patient monitoring: cardiac monitor, continuous pulse ox and blood pressure Approach: midline Location: L4-5 Injection technique: single-shot Needle Needle type: Pencan  Needle gauge: 24 G Needle length: 9 cm Assessment Sensory level: T10 Additional Notes Functioning IV was confirmed and monitors were applied. Sterile prep and drape, including hand hygiene and sterile gloves were used. The patient was positioned and the spine was prepped. The skin was anesthetized with lidocaine.  Free flow of clear CSF was obtained prior to injecting local anesthetic into the CSF.  The spinal needle aspirated freely following injection.  The needle was carefully withdrawn.  The patient tolerated the procedure well.

## 2019-02-05 NOTE — Op Note (Signed)
Cesarean Section Operative Report  PATIENT: Yvette Patel  PROCEDURE DATE: 02/05/2019  PREOPERATIVE DIAGNOSES: Intrauterine pregnancy at 5628w0d weeks gestation; previous CS x2  POSTOPERATIVE DIAGNOSES: The same  PROCEDURE: Repeat Low Transverse Cesarean Section  SURGEON:   Surgeon(s) and Role:    * Wouk, Wilfred CurtisNoah Bedford, MD - Primary    * Arvilla MarketWallace, Nivan Melendrez Lauren, DO - Assisting - OB Fellow   INDICATIONS: Yvette SnowballJoy Deblanc is a 43 y.o. [phone] at 8228w0d here for cesarean section secondary to the indications listed under preoperative diagnoses; please see preoperative note for further details.  The risks of cesarean section were discussed with the patient including but were not limited to: bleeding which may require transfusion or reoperation; infection which may require antibiotics; injury to bowel, bladder, ureters or other surrounding organs; injury to the fetus; need for additional procedures including hysterectomy in the event of a life-threatening hemorrhage; placental abnormalities wth subsequent pregnancies, incisional problems, thromboembolic phenomenon and other postoperative/anesthesia complications.   The patient concurred with the proposed plan, giving informed written consent for the procedure.    FINDINGS:  Viable female infant in cephalic presentation.  Apgars 9 and 9.  Weight: 2574g. Heavy meconium stained amniotic fluid.  Intact placenta, three vessel cord. Nuchal cord x1.  Normal uterus, fallopian tubes and ovaries bilaterally.  ANESTHESIA: Spinal INTRAVENOUS FLUIDS:2400 mL  ESTIMATED BLOOD LOSS: 502 mL URINE OUTPUT:  125 ml SPECIMENS: Placenta sent to L&D COMPLICATIONS: None immediate  PROCEDURE IN DETAIL:  The patient preoperatively received intravenous antibiotics and had sequential compression devices applied to her lower extremities.  She was then taken to the operating room where spinal anesthesia was administered and was found to be adequate. She was then placed in a dorsal  supine position with a leftward tilt, and prepped and draped in a sterile manner.  A foley catheter was placed into her bladder and attached to constant gravity.    After an adequate timeout was performed, a Pfannenstiel skin incision was made with scalpel just above her preexisting scar and carried through to the underlying layer of fascia. The fascia was incised in the midline, and this incision was extended bilaterally using the Mayo scissors.  Kocher clamps were applied to the superior aspect of the fascial incision and the underlying rectus muscles were dissected off bluntly.  A similar process was carried out on the inferior aspect of the fascial incision. The rectus muscles were separated in the midline bluntly and the peritoneum was entered bluntly. Attention was turned to the lower uterine segment where a low transverse hysterotomy was made with a scalpel and extended bilaterally bluntly.  The infant was successfully delivered, the cord was clamped and cut after one minute, and the infant was handed over to the awaiting neonatology team. Uterine massage was then administered, and the placenta delivered intact with a three-vessel cord. The uterus was then cleared of clots and debris.  The hysterotomy was closed with 0 Vicryl in a running locked fashion, and an imbricating layer was also placed with 0 Vicryl.  Figure-of-eight 0 Vicryl serosal stitches were placed to help with hemostasis.  The pelvis was cleared of all clot and debris. Hemostasis was confirmed on all surfaces.  The peritoneum was closed with a 0 Vicryl running stitch. The fascia was then closed using 0 Vicryl in a running fashion.  The subcutaneous layer was irrigated.  The skin was closed with a 4-0 Vicryl subcuticular stitch.   The patient tolerated the procedure well. Sponge, lap, instrument and needle counts  were correct x 3.  She was taken to the recovery room in stable condition.   An experienced assistant was required given the  standard of surgical care given the complexity of the case.  This assistant was needed for exposure, dissection, suctioning, retraction, instrument exchange, assisting with delivery with administration of fundal pressure, and for overall help during the procedure.   Maternal Disposition: PACU - hemodynamically stable.   Infant Disposition: stable   Phill Myron, D.O. OB Fellow  02/05/2019, 11:44 AM

## 2019-02-05 NOTE — H&P (Addendum)
LABOR AND DELIVERY ADMISSION HISTORY AND PHYSICAL NOTE  Yvette Patel is a 43 y.o. female [phone] with IUP at 5726w0d by L/20 presenting for scheduled repeat c/s.   She reports positive fetal movement. She denies leakage of fluid or vaginal bleeding. No ha or vision change.  Prenatal History/Complications:  Past Medical History: Past Medical History:  Diagnosis Date  . Diabetes mellitus without complication (HCC)   . Diabetic retinopathy (HCC)   . Diabetic retinopathy (HCC)   . Hypertension   . Spinal headache   . Vitreous hemorrhage of right eye due to diabetes mellitus [facility](HCC)     Past Surgical History: Past Surgical History:  Procedure Laterality Date  . CESAREAN SECTION    . DILATION AND CURETTAGE OF UTERUS      Obstetrical History: OB History    Gravida  5   Para  3   Term      Preterm  3   AB  1   Living  3     SAB      TAB  1   Ectopic      Multiple      Live Births  3           Social History: Social History   Socioeconomic History  . Marital status: Single    Spouse name: Not on file  . Number of children: Not on file  . Years of education: Not on file  . Highest education level: Not on file  Occupational History  . Not on file  Social Needs  . Financial resource strain: Not hard at all  . Food insecurity:    Worry: Sometimes true    Inability: Sometimes true  . Transportation needs:    Medical: No    Non-medical: No  Tobacco Use  . Smoking status: Former Smoker    Packs/day: 0.00    Years: 0.00    Pack years: 0.00  . Smokeless tobacco: Never Used  Substance and Sexual Activity  . Alcohol use: No  . Drug use: No  . Sexual activity: Yes    Birth control/protection: None  Lifestyle  . Physical activity:    Days per week: Not on file    Minutes per session: Not on file  . Stress: To some extent  Relationships  . Social connections:    Talks on phone: Not on file    Gets together: Not on file    Attends religious service:  Not on file    Active member of club or organization: Not on file    Attends meetings of clubs or organizations: Not on file    Relationship status: Not on file  Other Topics Concern  . Not on file  Social History Narrative  . Not on file    Family History: Family History  Problem Relation Age of Onset  . Kidney disease Mother   . Diabetes Mother   . Heart failure Mother   . Diabetes Father   . Hypertension Father   . Heart disease Father   . Diabetes Sister     Allergies: Allergies  Allergen Reactions  . Chlorine Hives and Other (See Comments)    Skin peels  . Mushroom Ext Cmplx(Shiitake-Reishi-Mait) Nausea And Vomiting  . Sulfa Antibiotics Other (See Comments)    Turned eyes blood shot and teared blood  . Citric Acid Rash and Other (See Comments)    Skin peels  . Penicillins Itching and Nausea And Vomiting    Did it  involve swelling of the face/tongue/throat, SOB, or low BP? No Did it involve sudden or severe rash/hives, skin peeling, or any reaction on the inside of your mouth or nose? No Did you need to seek medical attention at a hospital or doctor's office? Yes When did it last happen?10+ years If all above answers are "NO", may proceed with cephalosporin use.     Facility-Administered Medications Prior to Admission  Medication Dose Route Frequency Provider Last Rate Last Dose  . HYDROXYprogesterone Caproate SOAJ 275 mg  275 mg Subcutaneous Once Woodroe Mode, MD       Medications Prior to Admission  Medication Sig Dispense Refill Last Dose  . Ascorbic Acid (VITAMIN C) 1000 MG tablet Take 1,000 mg by mouth daily.   Taking  . aspirin EC 81 MG tablet Take 1 tablet (81 mg total) by mouth daily. 150 tablet 2   . insulin aspart (NOVOLOG) 100 UNIT/ML injection Inject 3-5 Units into the skin See admin instructions. Inject 5 units with each meal, inject 3 units in between meals with snacks     . Insulin Detemir (LEVEMIR FLEXTOUCH) 100 UNIT/ML Pen Inject 20  Units into the skin at bedtime.      . metFORMIN (GLUCOPHAGE-XR) 500 MG 24 hr tablet Take 1,000 mg by mouth 2 (two) times a day.   1   . NIFEdipine (PROCARDIA XL/NIFEDICAL XL) 60 MG 24 hr tablet Take 60 mg by mouth 2 (two) times a day.     . Prenatal Vit-Fe Fumarate-FA (PRENATAL VITAMIN PO) Take 1 tablet by mouth daily.         Review of Systems   All systems reviewed and negative except as stated in HPI  Blood pressure (!) 152/82, pulse 100, temperature 98 F (36.7 C), temperature source Oral, resp. rate 18, height 5\' 10"  (1.778 m), weight 104.3 kg, last menstrual period 05/08/2018, SpO2 100 %. General appearance: alert, cooperative and appears stated age Lungs: clear to auscultation bilaterally Heart: regular rate and rhythm Abdomen: soft, non-tender; bowel sounds normal Extremities: No calf swelling or tenderness     Prenatal labs: ABO, Rh: A/Positive/-- (11/15 0000) Antibody: Negative (11/15 0000) Rubella: Immune (11/15 0000) RPR: Non Reactive (04/17 0955)  HBsAg: Negative (11/15 0000)  HIV: Non Reactive (04/17 0955)  GBS:   positive 1 hr Glucola: n/a Genetic screening:  Nips neg Anatomy US: wnl  Prenatal Transfer Tool  Maternal Diabetes: Yes:  Diabetes Type:  Pre-pregnancy Genetic Screening: Normal Maternal Ultrasounds/Referrals: Normal Fetal Ultrasounds or other Referrals:  Fetal echo Maternal Substance Abuse:  No Significant Maternal Medications:  Metformin, aspirin, procardia, insulin Significant Maternal Lab Results: Lab values include: Group B Strep positive  Results for orders placed or performed during the hospital encounter of 02/05/19 (from the past 24 hour(s))  CBC   Collection Time: 02/05/19  7:00 AM  Result Value Ref Range   WBC 9.8 4.0 - 10.5 K/uL   RBC 4.02 3.87 - 5.11 MIL/uL   Hemoglobin 11.3 (L) 12.0 - 15.0 g/dL   HCT 34.5 (L) 36.0 - 46.0 %   MCV 85.8 80.0 - 100.0 fL   MCH 28.1 26.0 - 34.0 pg   MCHC 32.8 30.0 - 36.0 g/dL   RDW 15.2 11.5 - 15.5  %   Platelets 192 150 - 400 K/uL   nRBC 0.0 0.0 - 0.2 %    Patient Active Problem List   Diagnosis Date Noted  . GBS (group B Streptococcus carrier), +RV culture, currently pregnant 01/17/2019  .  AMA (advanced maternal age) multigravida 35+ 01/06/2019  . Obesity in pregnancy 01/06/2019  . Chronic hypertension 10/31/2018  . Supervision of high risk pregnancy, antepartum 09/23/2018  . Previous cesarean delivery, antepartum 09/23/2018  . History of preterm delivery 09/23/2018  . Diabetes mellitus complicating pregnancy 09/23/2018  . Diabetic retinopathy (HCC)   . 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 [id number] at 7159w0d here for scheduled repeat cesarean section (hx two prior cesarean sections). Prior op notes (2003 and 2007 [facility]) not available; pt recalls no complications  The risks of cesarean section were discussed with the patient including but were not limited to: bleeding which may require transfusion or reoperation; infection which may require antibiotics; injury to bowel, bladder, ureters or other surrounding organs; injury to the fetus; need for additional procedures including hysterectomy in the event of a life-threatening hemorrhage; placental abnormalities wth subsequent pregnancies, incisional problems, thromboembolic phenomenon and other postoperative/anesthesia complications. Anesthesia and OR aware.  Preoperative prophylactic antibiotics and SCDs ordered on call to the OR.  To OR when ready.  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 Fasting glucose today 83. With ocular and kidney complications. [facility]. Plan to re-start metformin postpartum, close endocrine f/u.  # chronic hypertension - per patient unaware of history of chronic hypertension, appears diagnosed early in pregnancy. Thus not on meds prior. Plan to start amlodipine pp,  likely transition to acei as outpt. Pt advised needs PCP.  #ID:  gbs positive, no indication to treat. pcn allergy as child, no hives or respiratory symptoms, will plan on ancef for ppx. covid screen negative #MOF: breast/bottle #MOC: paraguard iud #Circ:  n/a  Corban Kistler B Jasey Cortez 02/05/2019, 8:57 AM

## 2019-02-06 ENCOUNTER — Encounter: Payer: BLUE CROSS/BLUE SHIELD | Admitting: Obstetrics & Gynecology

## 2019-02-06 LAB — GLUCOSE, RANDOM: Glucose, Bld: 142 mg/dL — ABNORMAL HIGH (ref 70–99)

## 2019-02-06 LAB — CBC
HCT: 27.9 % — ABNORMAL LOW (ref 36.0–46.0)
Hemoglobin: 9.2 g/dL — ABNORMAL LOW (ref 12.0–15.0)
MCH: 28.5 pg (ref 26.0–34.0)
MCHC: 33 g/dL (ref 30.0–36.0)
MCV: 86.4 fL (ref 80.0–100.0)
Platelets: 168 10*3/uL (ref 150–400)
RBC: 3.23 MIL/uL — ABNORMAL LOW (ref 3.87–5.11)
RDW: 15.1 % (ref 11.5–15.5)
WBC: 7.8 10*3/uL (ref 4.0–10.5)
nRBC: 0 % (ref 0.0–0.2)

## 2019-02-06 LAB — ABO/RH: ABO/RH(D): A POS

## 2019-02-06 LAB — BIRTH TISSUE RECOVERY COLLECTION (PLACENTA DONATION)

## 2019-02-06 LAB — CREATININE, SERUM
Creatinine, Ser: 0.93 mg/dL (ref 0.44–1.00)
GFR calc Af Amer: >60 mL/min (ref >60)
GFR calc non Af Amer: >60 mL/min (ref >60)

## 2019-02-06 NOTE — Progress Notes (Signed)
Patient screened out for psychosocial assessment since none of the following apply: °Psychosocial stressors documented in mother or baby's chart °Gestation less than 32 weeks °Code at delivery  °Infant with anomalies °Please contact the Clinical Social Worker if specific needs arise, by MOB's request, or if MOB scores greater than 9/yes to question 10 on Edinburgh Postpartum Depression Screen. ° °Yvette Patel, MSW, LCSW °Clinical Social Work °[phone] °  °

## 2019-02-06 NOTE — Progress Notes (Addendum)
Subjective: Postpartum Day 1: Cesarean Delivery Patient reports incisional pain and tolerating PO.   Baby is in NICU for observation Patient has complained of pain despite receiving ibuprofen and percocet.  RN has done other comfort measures.  Patient does not appear uncomfortable   Breast pumping   Objective: Vital signs in last 24 hours: Temp:  [97.7 F (36.5 C)-98 F (36.7 C)] 97.8 F (36.6 C) (06/11 0508) Pulse Rate:  [84-100] 87 (06/11 0508) Resp:  [12-19] 18 (06/11 0508) BP: (138-155)/(41-99) 154/83 (06/11 0508) SpO2:  [98 %-100 %] 100 % (06/10 1633) Weight:  [104.3 kg] 104.3 kg (06/10 0749) Vitals:   02/05/19 1515 02/05/19 1633 02/05/19 2049 02/06/19 0508  BP: 138/76 (!) 149/78 (!) 155/81 (!) 154/83  Pulse: 87 84 87 87  Resp: 16 18 18 18   Temp: 97.7 F (36.5 C) 97.7 F (36.5 C) 97.7 F (36.5 C) 97.8 F (36.6 C)  TempSrc: Oral Oral Axillary Oral  SpO2: 100% 100%    Weight:      Height:        Physical Exam:  General: alert, cooperative and no distress Lochia: appropriate Uterine Fundus: firm Incision: no significant drainage DVT Evaluation: No evidence of DVT seen on physical exam.  Recent Labs    02/05/19 0700 02/06/19 0534  HGB 11.3* 9.2*  HCT 34.5* 27.9*    Assessment/Plan: Status post Cesarean section. Doing well postoperatively.  Continue current care. Advised RN to continue ibuprofen and Oxy cycling, get pt OOB.  Hansel Feinstein 02/06/2019, 6:25 AM

## 2019-02-07 LAB — COMPREHENSIVE METABOLIC PANEL
ALT: 19 U/L (ref 0–44)
AST: 23 U/L (ref 15–41)
Albumin: 2.4 g/dL — ABNORMAL LOW (ref 3.5–5.0)
Alkaline Phosphatase: 89 U/L (ref 38–126)
Anion gap: 9 (ref 5–15)
BUN: 15 mg/dL (ref 6–20)
CO2: 23 mmol/L (ref 22–32)
Calcium: 9 mg/dL (ref 8.9–10.3)
Chloride: 106 mmol/L (ref 98–111)
Creatinine, Ser: 1.15 mg/dL — ABNORMAL HIGH (ref 0.44–1.00)
GFR calc Af Amer: >60 mL/min (ref >60)
GFR calc non Af Amer: 59 mL/min — ABNORMAL LOW (ref >60)
Glucose, Bld: 112 mg/dL — ABNORMAL HIGH (ref 70–99)
Potassium: 4.5 mmol/L (ref 3.5–5.1)
Sodium: 138 mmol/L (ref 135–145)
Total Bilirubin: 0.3 mg/dL (ref 0.3–1.2)
Total Protein: 6.3 g/dL — ABNORMAL LOW (ref 6.5–8.1)

## 2019-02-07 LAB — GLUCOSE, CAPILLARY
Glucose-Capillary: 72 mg/dL (ref 70–99)
Glucose-Capillary: 142 mg/dL — ABNORMAL HIGH (ref 70–99)

## 2019-02-07 MED ORDER — FERROUS FUMARATE 324 (106 FE) MG PO TABS
1.0000 | ORAL_TABLET | Freq: Every day | ORAL | Status: DC
  Filled 2019-02-07: qty 1

## 2019-02-07 MED ORDER — FERROUS FUMARATE 324 (106 FE) MG PO TABS
1.0000 | ORAL_TABLET | Freq: Every day | ORAL | Status: DC
  Administered 2019-02-07 – 2019-02-08 (×2): 106 mg via ORAL
  Filled 2019-02-07: qty 1

## 2019-02-07 NOTE — Lactation Note (Signed)
This note was copied from a baby's chart. Lactation Consultation Note  Patient Name: Yvette Patel [id number] Date: 02/07/2019 Reason for consult: Infant < 6lbs;Other (Comment)(milk is in both breast - boaderline areas of engorgement and really full - see LC note)  Baby is 55 hours  And per mom has been down to NICU today latch and baby is taking to the breast well.  Milk really came in today and pumped off earlier about 40 ml and took it to NICU. The baby feeds again at 9 pm .  LC offered to check her breast to make sure she wasn't having problems with engorgement.  LC both breast to be very warm and overly full nodules in the lateral aspects of both breast and the rest of the breast  Really very full.  Per mom hasn't eaten since breakfast , lunch sitting on over bed table and LC recommended  To mom to order a hot dinner.  [place] asked the nurse intern and the [facility] to fix mom 3 ice packs so when she lays back at 45 degree angle on will be across The top and one on each side.  LC reviewed with mom engorgement tx if to bring down the heat  And swelling of the breast so the milk will flow better  And it will be easier to control the over fullness and the baby to latch.  After icing - breast massage , hand express, and pump both breast for 15 -20 mins.   LC gave report to [employer]. And nurse intern and they will fix the ice for mom.   LC provided the paperwork for mom do obtain a [facility] loaner from [facility] tomorrow before D/C.  And she is aware of the $30.00 cash / 12 days for the loaner/ and what parts on the form to complete.     Maternal Data    Feeding Feeding Type: Breast Fed  LATCH Score                   Interventions Interventions: Breast feeding basics reviewed;Ice;DEBP  Lactation Tools Discussed/Used     Consult Status Consult Status: Follow-up Date: 02/08/19 Follow-up type: [place] 02/07/2019, 6:34 PM

## 2019-02-07 NOTE — Progress Notes (Signed)
Dr Maudie Mercury notified of CBG

## 2019-02-07 NOTE — Progress Notes (Addendum)
Subjective: Postpartum Day #2: Cesarean Delivery Patient reports tolerating PO and no problems voiding. Baby in NICU; pumping. Denies s/s pre-e. On amlodipine 10mg  with good control. FBS 112 this am, but pt was snacking in the night. Pt reports she isn't well-hydrated.  Objective: Vital signs in last 24 hours: Temp:  [97.8 F (36.6 C)-98.3 F (36.8 C)] 98.3 F (36.8 C) (06/12 1425) Pulse Rate:  [80-96] 86 (06/12 1425) Resp:  [17-18] 18 (06/12 1425) BP: (125-156)/(72-90) 125/72 (06/12 1425) SpO2:  [98 %-99 %] 99 % (06/12 1425)  Physical Exam:  General: alert, cooperative and mild distress Lochia: appropriate Uterine Fundus: firm Incision: pressure dsg intact, dry DVT Evaluation: No evidence of DVT seen on physical exam.  Recent Labs    02/05/19 0700 02/06/19 0534  HGB 11.3* 9.2*  HCT 34.5* 27.9*   CMP     Component Value Date/Time   NA 138 02/07/2019 0531   NA 136 09/23/2018 1457   K 4.5 02/07/2019 0531   CL 106 02/07/2019 0531   CO2 23 02/07/2019 0531   GLUCOSE 112 (H) 02/07/2019 0531   BUN 15 02/07/2019 0531   BUN 12 09/23/2018 1457   CREATININE 1.15 (H) 02/07/2019 0531   CALCIUM 9.0 02/07/2019 0531   PROT 6.3 (L) 02/07/2019 0531   PROT 6.3 09/23/2018 1457   ALBUMIN 2.4 (L) 02/07/2019 0531   ALBUMIN 3.8 09/23/2018 1457   AST 23 02/07/2019 0531   ALT 19 02/07/2019 0531   ALKPHOS 89 02/07/2019 0531   BILITOT 0.3 02/07/2019 0531   BILITOT 0.3 09/23/2018 1457   GFRNONAA 59 (L) 02/07/2019 0531   GFRAA >60 02/07/2019 0531    Assessment/Plan: Status post Cesarean section. Postoperative course complicated by elevated creatinine; cHTN (controlled on amlodipine), GDMA1 (metformin 1000bid).  Push po fluids today. Continue current care with CMET/CBC in the morning to track increasing creatinine. Will hold metformin due to creatinine 1.15 today. Start po iron qd.  Myrtis Ser CNM 02/07/2019, 2:32 PM

## 2019-02-08 LAB — CBC
HCT: 28.9 % — ABNORMAL LOW (ref 36.0–46.0)
Hemoglobin: 9.3 g/dL — ABNORMAL LOW (ref 12.0–15.0)
MCH: 28.2 pg (ref 26.0–34.0)
MCHC: 32.2 g/dL (ref 30.0–36.0)
MCV: 87.6 fL (ref 80.0–100.0)
Platelets: 182 10*3/uL (ref 150–400)
RBC: 3.3 MIL/uL — ABNORMAL LOW (ref 3.87–5.11)
RDW: 15.2 % (ref 11.5–15.5)
WBC: 9.5 10*3/uL (ref 4.0–10.5)
nRBC: 0 % (ref 0.0–0.2)

## 2019-02-08 LAB — COMPREHENSIVE METABOLIC PANEL
ALT: 20 U/L (ref 0–44)
AST: 24 U/L (ref 15–41)
Albumin: 2.5 g/dL — ABNORMAL LOW (ref 3.5–5.0)
Alkaline Phosphatase: 84 U/L (ref 38–126)
Anion gap: 8 (ref 5–15)
BUN: 14 mg/dL (ref 6–20)
CO2: 23 mmol/L (ref 22–32)
Calcium: 8.8 mg/dL — ABNORMAL LOW (ref 8.9–10.3)
Chloride: 105 mmol/L (ref 98–111)
Creatinine, Ser: 1.06 mg/dL — ABNORMAL HIGH (ref 0.44–1.00)
GFR calc Af Amer: >60 mL/min (ref >60)
GFR calc non Af Amer: >60 mL/min (ref >60)
Glucose, Bld: 131 mg/dL — ABNORMAL HIGH (ref 70–99)
Potassium: 4.1 mmol/L (ref 3.5–5.1)
Sodium: 136 mmol/L (ref 135–145)
Total Bilirubin: 0.3 mg/dL (ref 0.3–1.2)
Total Protein: 6.4 g/dL — ABNORMAL LOW (ref 6.5–8.1)

## 2019-02-08 LAB — GLUCOSE, CAPILLARY: Glucose-Capillary: 117 mg/dL — ABNORMAL HIGH (ref 70–99)

## 2019-02-08 MED ORDER — IBUPROFEN 800 MG PO TABS: 800.0000 mg | ORAL_TABLET | Freq: Three times a day (TID) | ORAL | 0 refills | Status: DC | PRN

## 2019-02-08 MED ORDER — FERROUS FUMARATE 324 (106 FE) MG PO TABS: 1.0000 | ORAL_TABLET | Freq: Every day | ORAL | 3 refills | Status: AC

## 2019-02-08 MED ORDER — LEVEMIR FLEXTOUCH 100 UNIT/ML ~~LOC~~ SOPN: 10.0000 [IU] | PEN_INJECTOR | Freq: Every day | SUBCUTANEOUS | 11 refills | Status: AC

## 2019-02-08 MED ORDER — INSULIN DETEMIR 100 UNIT/ML ~~LOC~~ SOLN
10.0000 [IU] | Freq: Every day | SUBCUTANEOUS | Status: DC
  Filled 2019-02-08 (×2): qty 0.1

## 2019-02-08 MED ORDER — OXYCODONE HCL 5 MG PO TABS: 5.0000 mg | ORAL_TABLET | ORAL | 0 refills | Status: AC | PRN

## 2019-02-08 MED ORDER — AMLODIPINE BESYLATE 10 MG PO TABS: 10.0000 mg | ORAL_TABLET | Freq: Every day | ORAL | 1 refills | Status: AC

## 2019-02-08 NOTE — Discharge Instructions (Signed)

## 2019-02-08 NOTE — Lactation Note (Signed)
This note was copied from a baby's chart. Lactation Consultation Note  Patient Name: Yvette Patel [id number] Date: 02/08/2019 Reason for consult: Follow-up assessment;NICU baby Randel Books is 25 hours old in the NICU.  Mom states baby latched this morning.  She is pumping every 3 hours and obtaining 20 mls from each breast.  Breasts are full this morning but not engorged.  Mom plans on rooming in with baby in the NICU.  Reminded to take all her pump pieces to use in the NICU.  If she decides to go home she will need a [facility] loaner.  Instructed to massage breasts well prior to pumping.  Encouraged to call for latch assist prn.  Questions answered.  Maternal Data    Feeding Feeding Type: Breast Milk Nipple Type: Slow - flow  LATCH Score Latch: Grasps breast easily, tongue down, lips flanged, rhythmical sucking.  Audible Swallowing: A few with stimulation  Type of Nipple: Flat  Comfort (Breast/Nipple): Soft / non-tender  Hold (Positioning): No assistance needed to correctly position infant at breast.  LATCH Score: 8  Interventions    Lactation Tools Discussed/Used     Consult Status Consult Status: Complete    Ave Filter 02/08/2019, 9:51 AM

## 2019-02-10 ENCOUNTER — Telehealth: Payer: Self-pay | Admitting: Family Medicine

## 2019-02-10 NOTE — Telephone Encounter (Signed)
Attempted to call patient with her incision and bp check appointment. No answer, and could not leave a voicemail because it was not setup. Appointment reminder mailed out

## 2019-02-14 ENCOUNTER — Telehealth: Payer: Self-pay | Admitting: Family Medicine

## 2019-02-14 NOTE — Telephone Encounter (Signed)
Spoke with patient about her appointment on 6/22 @ 1:30. Patient was instructed to wear a face mask and no visitors are allowed with her. Patient was screened for covid symptoms and denied having any

## 2019-02-17 ENCOUNTER — Ambulatory Visit (INDEPENDENT_AMBULATORY_CARE_PROVIDER_SITE_OTHER): Payer: BLUE CROSS/BLUE SHIELD

## 2019-02-17 ENCOUNTER — Other Ambulatory Visit: Payer: Self-pay

## 2019-02-17 VITALS — BP 138/80

## 2019-02-17 DIAGNOSIS — Z5189 Encounter for other specified aftercare: Secondary | ICD-10-CM

## 2019-02-17 MED ORDER — IBUPROFEN 800 MG PO TABS: 800.0000 mg | ORAL_TABLET | Freq: Three times a day (TID) | ORAL | 0 refills | Status: AC | PRN

## 2019-02-17 NOTE — Progress Notes (Signed)
Pt here today for BP check and wound check s/p c-section on 02/05/19.  BP LA manually 138/80.  Pt denies any HTN sx's.  Pt;s incision observed with the honeycomb still placed.  Incision well approximated, no odor, no drainage, and no erythema.  Pt reports some tenderness at the site.  I explained to the pt that is normal to feel some tenderness being her third c-section that will take a little more time to heal.  I encouraged the use of ambulation and ibuprofen to with swelling and tenderness.   Pt reports that she needs to get a refill on her ibuprofen as that she only had a few more pills.  Notified Dr. Ilda Basset pt BP and the request for ibuprofen.  Per provider pt can have a refill on her ibuprofen and to continue taking the Norvasc 10 mg po daily as prescribed, and we will f/u with her at her pp visit.   I also encouraged her to continue to monitor for sx's of HTN.  Pt verbalized understanding to provider's recommendation and had no further questions.   Mel Almond, RN 02/17/19 1506

## 2019-02-18 NOTE — Progress Notes (Signed)
I have reviewed the chart and agree with nursing staff's documentation of this patient's encounter.  Aleshka Corney, MD 02/18/2019 12:59 PM    

## 2019-02-23 ENCOUNTER — Other Ambulatory Visit: Payer: Self-pay

## 2019-02-23 ENCOUNTER — Encounter (HOSPITAL_COMMUNITY): Payer: Self-pay

## 2019-02-23 ENCOUNTER — Inpatient Hospital Stay (HOSPITAL_COMMUNITY)
Admission: AD | Admit: 2019-02-23 | Discharge: 2019-02-23 | Disposition: A | Discharge status: Home / Self Care | Payer: BLUE CROSS/BLUE SHIELD | Attending: Obstetrics and Gynecology | Admitting: Obstetrics and Gynecology

## 2019-02-23 DIAGNOSIS — O9089 Other complications of the puerperium, not elsewhere classified: Secondary | ICD-10-CM | POA: Insufficient documentation

## 2019-02-23 DIAGNOSIS — Z98891 History of uterine scar from previous surgery: Secondary | ICD-10-CM

## 2019-02-23 DIAGNOSIS — Z5189 Encounter for other specified aftercare: Secondary | ICD-10-CM

## 2019-02-23 NOTE — MAU Note (Signed)
Incision started to open (on rt side)back up, left side had come open, was seen 6/22.  Started having pain on rt a few days ago, sister looked at it last night and discovered it was opening up. Not draining.

## 2019-02-23 NOTE — Discharge Instructions (Signed)
Postpartum Care After Cesarean Delivery °This sheet gives you information about how to care for yourself from the time you deliver your baby to up to 6-12 weeks after delivery (postpartum period). Your health care provider may also give you more specific instructions. If you have problems or questions, contact your health care provider. °Follow these instructions at home: °Medicines °· Take over-the-counter and prescription medicines only as told by your health care provider. °· If you were prescribed an antibiotic medicine, take it as told by your health care provider. Do not stop taking the antibiotic even if you start to feel better. °· Ask your health care provider if the medicine prescribed to you: °? Requires you to avoid driving or using heavy machinery. °? Can cause constipation. You may need to take actions to prevent or treat constipation, such as: °§ Drink enough fluid to keep your urine pale yellow. °§ Take over-the-counter or prescription medicines. °§ Eat foods that are high in fiber, such as beans, whole grains, and fresh fruits and vegetables. °§ Limit foods that are high in fat and processed sugars, such as fried or sweet foods. °Activity °· Gradually return to your normal activities as told by your health care provider. °· Avoid activities that take a lot of effort and energy (are strenuous) until approved by your health care provider. Walking at a slow to moderate pace is usually safe. Ask your health care provider what activities are safe for you. °? Do not lift anything that is heavier than your baby or 10 lb (4.5 kg) as told by your health care provider. °? Do not vacuum, climb stairs, or drive a car for as long as told by your health care provider. °· If possible, have someone help you at home until you are able to do your usual activities yourself. °· Rest as much as possible. Try to rest or take naps while your baby is sleeping. °Vaginal bleeding °· It is normal to have vaginal bleeding  (lochia) after delivery. Wear a sanitary pad to absorb vaginal bleeding and discharge. °? During the first week after delivery, the amount and appearance of lochia is often similar to a menstrual period. °? Over the next few weeks, it will gradually decrease to a dry, yellow-brown discharge. °? For most women, lochia stops completely by 4-6 weeks after delivery. Vaginal bleeding can vary from woman to woman. °· Change your sanitary pads frequently. Watch for any changes in your flow, such as: °? A sudden increase in volume. °? A change in color. °? Large blood clots. °· If you pass a blood clot, save it and call your health care provider to discuss. Do not flush blood clots down the toilet before you get instructions from your health care provider. °· Do not use tampons or douches until your health care provider says this is safe. °· If you are not breastfeeding, your period should return 6-8 weeks after delivery. If you are breastfeeding, your period may return anytime between 8 weeks after delivery and the time that you stop breastfeeding. °Perineal care ° °· If your C-section (Cesarean section) was unplanned, and you were allowed to labor and push before delivery, you may have pain, swelling, and discomfort of the tissue between your vaginal opening and your anus (perineum). You may also have an incision in the tissue (episiotomy) or the tissue may have torn during delivery. Follow these instructions as told by your health care provider: °? Keep your perineum clean and dry as told by   your health care provider. Use medicated pads and pain-relieving sprays and creams as directed. °? If you have an episiotomy or vaginal tear, check the area every day for signs of infection. Check for: °§ Redness, swelling, or pain. °§ Fluid or blood. °§ Warmth. °§ Pus or a bad smell. °? You may be given a squirt bottle to use instead of wiping to clean the perineum area after you go to the bathroom. As you start healing, you may use  the squirt bottle before wiping yourself. Make sure to wipe gently. °? To relieve pain caused by an episiotomy, vaginal tear, or hemorrhoids, try taking a warm sitz bath 2-3 times a day. A sitz bath is a warm water bath that is taken while you are sitting down. The water should only come up to your hips and should cover your buttocks. °Breast care °· Within the first few days after delivery, your breasts may feel heavy, full, and uncomfortable (breast engorgement). You may also have milk leaking from your breasts. Your health care provider can suggest ways to help relieve breast discomfort. Breast engorgement should go away within a few days. °· If you are breastfeeding: °? Wear a bra that supports your breasts and fits you well. °? Keep your nipples clean and dry. Apply creams and ointments as told by your health care provider. °? You may need to use breast pads to absorb milk leakage. °? You may have uterine contractions every time you breastfeed for several weeks after delivery. Uterine contractions help your uterus return to its normal size. °? If you have any problems with breastfeeding, work with your health care provider or a lactation consultant. °· If you are not breastfeeding: °? Avoid touching your breasts as this can make your breasts produce more milk. °? Wear a well-fitting bra and use cold packs to help with swelling. °? Do not squeeze out (express) milk. This causes you to make more milk. °Intimacy and sexuality °· Ask your health care provider when you can engage in sexual activity. This may depend on your: °? Risk of infection. °? Healing rate. °? Comfort and desire to engage in sexual activity. °· You are able to get pregnant after delivery, even if you have not had your period. If desired, talk with your health care provider about methods of family planning or birth control (contraception). °Lifestyle °· Do not use any products that contain nicotine or tobacco, such as cigarettes, e-cigarettes,  and chewing tobacco. If you need help quitting, ask your health care provider. °· Do not drink alcohol, especially if you are breastfeeding. °Eating and drinking ° °· Drink enough fluid to keep your urine pale yellow. °· Eat high-fiber foods every day. These may help prevent or relieve constipation. High-fiber foods include: °? Whole grain cereals and breads. °? Brown rice. °? Beans. °? Fresh fruits and vegetables. °· Take your prenatal vitamins until your postpartum checkup or until your health care provider tells you it is okay to stop. °General instructions °· Keep all follow-up visits for you and your baby as told by your health care provider. Most women visit their health care provider for a postpartum checkup within the first 3-6 weeks after delivery. °Contact a health care provider if you: °· Feel unable to cope with the changes that a new baby brings to your life, and these feelings do not go away. °· Feel unusually sad or worried. °· Have breasts that are painful, hard, or turn red. °· Have a fever. °·   Have trouble holding urine or keeping urine from leaking.  Have little or no interest in activities you used to enjoy.  Have not breastfed at all and you have not had a menstrual period for 12 weeks after delivery.  Have stopped breastfeeding and you have not had a menstrual period for 12 weeks after you stopped breastfeeding.  Have questions about caring for yourself or your baby.  Pass a blood clot from your vagina. Get help right away if you:  Have chest pain.  Have difficulty breathing.  Have sudden, severe leg pain.  Have severe pain or cramping in your abdomen.  Bleed from your vagina so much that you fill more than one sanitary pad in one hour. Bleeding should not be heavier than your heaviest period.  Develop a severe headache.  Faint.  Have blurred vision or spots in your vision.  Have a bad-smelling vaginal discharge.  Have thoughts about hurting yourself or your  baby. If you ever feel like you may hurt yourself or others, or have thoughts about taking your own life, get help right away. You can go to your nearest emergency department or call:  Your local emergency services (911 in the [place]  A suicide crisis helpline, such as the [facility] at 250 512 1009. This is open 24 hours a day. Summary  The period of time from when you deliver your baby to up to 6-12 weeks after delivery is called the postpartum period.  Gradually return to your normal activities as told by your health care provider.  Keep all follow-up visits for you and your baby as told by your health care provider. This information is not intended to replace advice given to you by your health care provider. Make sure you discuss any questions you have with your health care provider. Document Released: 08/11/2000 Document Revised: 04/03/2018 Document Reviewed: 04/03/2018 Elsevier Patient Education  2020 [place]. Cesarean Delivery, Care After This sheet gives you information about how to care for yourself after your procedure. Your health care provider may also give you more specific instructions. If you have problems or questions, contact your health care provider. What can I expect after the procedure? After the procedure, it is common to have:  A small amount of blood or clear fluid coming from the incision.  Some redness, swelling, and pain in your incision area.  Some abdominal pain and soreness.  Vaginal bleeding (lochia). Even though you did not have a vaginal delivery, you will still have vaginal bleeding and discharge.  Pelvic cramps.  Fatigue. You may have pain, swelling, and discomfort in the tissue between your vagina and your anus (perineum) if:  Your C-section was unplanned, and you were allowed to labor and push.  An incision was made in the area (episiotomy) or the tissue tore during attempted vaginal delivery. Follow these  instructions at home: Incision care   Follow instructions from your health care provider about how to take care of your incision. Make sure you: ? Wash your hands with soap and water before you change your bandage (dressing). If soap and water are not available, use hand sanitizer. ? If you have a dressing, change it or remove it as told by your health care provider. ? Leave stitches (sutures), skin staples, skin glue, or adhesive strips in place. These skin closures may need to stay in place for 2 weeks or longer. If adhesive strip edges start to loosen and curl up, you may trim the loose edges. Do  not remove adhesive strips completely unless your health care provider tells you to do that.  Check your incision area every day for signs of infection. Check for: ? More redness, swelling, or pain. ? More fluid or blood. ? Warmth. ? Pus or a bad smell.  Do not take baths, swim, or use a hot tub until your health care provider says it's okay. Ask your health care provider if you can take showers.  When you cough or sneeze, hug a pillow. This helps with pain and decreases the chance of your incision opening up (dehiscing). Do this until your incision heals. Medicines  Take over-the-counter and prescription medicines only as told by your health care provider.  If you were prescribed an antibiotic medicine, take it as told by your health care provider. Do not stop taking the antibiotic even if you start to feel better.  Do not drive or use heavy machinery while taking prescription pain medicine. Lifestyle  Do not drink alcohol. This is especially important if you are breastfeeding or taking pain medicine.  Do not use any products that contain nicotine or tobacco, such as cigarettes, e-cigarettes, and chewing tobacco. If you need help quitting, ask your health care provider. Eating and drinking  Drink at least 8 eight-ounce glasses of water every day unless told not to by your health care  provider. If you breastfeed, you may need to drink even more water.  Eat high-fiber foods every day. These foods may help prevent or relieve constipation. High-fiber foods include: ? Whole grain cereals and breads. ? [place]ider rice. ? Beans. ? Fresh fruits and vegetables. Activity   If possible, have someone help you care for your baby and help with household activities for at least a few days after you leave the hospital.  Return to your normal activities as told by your health care provider. Ask your health care provider what activities are safe for you.  Rest as much as possible. Try to rest or take a nap while your baby is sleeping.  Do not lift anything that is heavier than 10 lbs (4.5 kg), or the limit that you were told, until your health care provider says that it is safe.  Talk with your health care provider about when you can engage in sexual activity. This may depend on your: ? Risk of infection. ? How fast you heal. ? Comfort and desire to engage in sexual activity. General instructions  Do not use tampons or douches until your health care provider approves.  Wear loose, comfortable clothing and a supportive and well-fitting bra.  Keep your perineum clean and dry. Wipe from front to back when you use the toilet.  If you pass a blood clot, save it and call your health care provider to discuss. Do not flush blood clots down the toilet before you get instructions from your health care provider.  Keep all follow-up visits for you and your baby as told by your health care provider. This is important. Contact a health care provider if:  You have: ? A fever. ? Bad-smelling vaginal discharge. ? Pus or a bad smell coming from your incision. ? Difficulty or pain when urinating. ? A sudden increase or decrease in the frequency of your bowel movements. ? More redness, swelling, or pain around your incision. ? More fluid or blood coming from your incision. ? A  rash. ? Nausea. ? Little or no interest in activities you used to enjoy. ? Questions about caring for yourself or  your baby.  Your incision feels warm to the touch.  Your breasts turn red or become painful or hard.  You feel unusually sad or worried.  You vomit.  You pass a blood clot from your vagina.  You urinate more than usual.  You are dizzy or light-headed. Get help right away if:  You have: ? Pain that does not go away or get better with medicine. ? Chest pain. ? Difficulty breathing. ? Blurred vision or spots in your vision. ? Thoughts about hurting yourself or your baby. ? New pain in your abdomen or in one of your legs. ? A severe headache.  You faint.  You bleed from your vagina so much that you fill more than one sanitary pad in one hour. Bleeding should not be heavier than your heaviest period. Summary  After the procedure, it is common to have pain at your incision site, abdominal cramping, and slight bleeding from your vagina.  Check your incision area every day for signs of infection.  Tell your health care provider about any unusual symptoms.  Keep all follow-up visits for you and your baby as told by your health care provider. This information is not intended to replace advice given to you by your health care provider. Make sure you discuss any questions you have with your health care provider. Document Released: 05/06/2002 Document Revised: 02/20/2018 Document Reviewed: 02/20/2018 [employer] Patient Education  2020 [employer]

## 2019-02-23 NOTE — MAU Provider Note (Signed)
History     CSN: [id number]  Arrival date and time: 02/23/19 1029   First Provider Initiated Contact with Patient 02/23/19 1113      Chief Complaint  Patient presents with   Post-op Problem   Ms. Yvette SnowballJoy Gregory is a 43 y.o. [id number] at postpartum who presents to MAU for an incision check. Pt reports she sister looked at her incision last night and said it was starting to "open up" on the right side. Pt reports she started having pain on that side a few days ago. Pt reports she was seen in the office on 02/17/2019, and needed glue to close-up the right side of her incision. Of note, office note by nurse makes no mention of this and reads "Incision well approximated, no odor, no drainage, and no erythema." Pt denies any s/sx of infection today including warmth, drainage, swelling, redness.  Pt reports she has used all her Percocet medication, and has only been taking ibuprofen on an as needed basis.   OB History    Gravida  5   Para  4   Term  1   Preterm  3   AB  1   Living  4     SAB      TAB  1   Ectopic      Multiple  0   Live Births  4           Past Medical History:  Diagnosis Date   Diabetes mellitus without complication (HCC)    Diabetic retinopathy (HCC)    Diabetic retinopathy (HCC)    Hypertension    Spinal headache    Vitreous hemorrhage of right eye due to diabetes mellitus [facility](HCC)     Past Surgical History:  Procedure Laterality Date   CESAREAN SECTION     CESAREAN SECTION N/A 02/05/2019   Procedure: CESAREAN SECTION;  Surgeon: Kathrynn RunningWouk, Noah Bedford, MD;  Location: [facility] LD ORS;  Service: Obstetrics;  Laterality: N/A;   DILATION AND CURETTAGE OF UTERUS      Family History  Problem Relation Age of Onset   Kidney disease Mother    Diabetes Mother    Heart failure Mother    Diabetes Father    Hypertension Father    Heart disease Father    Diabetes Sister     Social History   Tobacco Use   Smoking status: Former Smoker   Packs/day: 0.00    Years: 0.00    Pack years: 0.00   Smokeless tobacco: Never Used  Substance Use Topics   Alcohol use: No   Drug use: No    Allergies:  Allergies  Allergen Reactions   Chlorine Hives and Other (See Comments)    Skin peels   Mushroom Ext Cmplx(Shiitake-Reishi-Mait) Nausea And Vomiting   Sulfa Antibiotics Other (See Comments)    Turned eyes blood shot and teared blood   Citric Acid Rash and Other (See Comments)    Skin peels   Penicillins Itching and Nausea And Vomiting    Did it involve swelling of the face/tongue/throat, SOB, or low BP? No Did it involve sudden or severe rash/hives, skin peeling, or any reaction on the inside of your mouth or nose? No Did you need to seek medical attention at a hospital or doctor's office? Yes When did it last happen?10+ years If all above answers are NO, may proceed with cephalosporin use.     No medications prior to admission.    Review of Systems  Constitutional: Negative  for chills, diaphoresis, fatigue and fever.  Respiratory: Negative for shortness of breath.   Cardiovascular: Negative for chest pain.  Gastrointestinal: Negative for abdominal pain, constipation, diarrhea, nausea and vomiting.  Genitourinary: Positive for pelvic pain and vaginal bleeding. Negative for dysuria, flank pain, frequency, urgency and vaginal discharge.  Neurological: Negative for dizziness, weakness, light-headedness and headaches.   Physical Exam   Blood pressure 140/77, pulse 85, temperature 98.2 F (36.8 C), temperature source Oral, resp. rate 18, SpO2 100 %, currently breastfeeding.  Patient Vitals for the past 24 hrs:  BP Temp Temp src Pulse Resp SpO2  02/23/19 1141 140/77 -- -- 85 -- --  02/23/19 1102 118/68 -- -- 83 -- --  02/23/19 1044 (!) 142/76 98.2 F (36.8 C) Oral 86 18 100 %   Physical Exam  Constitutional: She is oriented to person, place, and time. She appears well-developed and well-nourished. No  distress.  HENT:  Head: Normocephalic and atraumatic.  Respiratory: Effort normal.  GI: Soft. She exhibits no distension and no mass. There is no abdominal tenderness. There is no rebound and no guarding.  Neurological: She is alert and oriented to person, place, and time.  Skin: Skin is warm and dry. She is not diaphoretic.  Psychiatric: She has a normal mood and affect. Her behavior is normal. Judgment and thought content normal.   No results found for this or any previous visit (from the past 24 hour(s)).  [place] Mfm Fetal Bpp Wo Non Stress  Result Date: 01/31/2019 ----------------------------------------------------------------------  OBSTETRICS REPORT                       (Signed Final 01/31/2019 09:12 am) ---------------------------------------------------------------------- Patient Info  ID #:       [id number]                          D.O.B.:  1976/07/20 (42 yrs)  Name:       Yvette Patel                    Visit Date: 01/30/2019 02:27 pm ---------------------------------------------------------------------- Performed By  Performed By:     Marcellina MillinKelly L Moser          Ref. Address:     [address]                    [place]                                                             [address]  Attending:        Lin Landsmanorenthian Booker      [facility]                                       [facility]  [facility]:      [facility] ---------------------------------------------------------------------- Orders   #  Description  Code         Ordered By   1  US MFM FETAL BPP WO NON              [id number]     TANYA PRATT      STRESS  ----------------------------------------------------------------------   #  Order #                    Accession #                 Episode #   1  [id number]                  [id number]  ---------------------------------------------------------------------- Indications   Hypertension -  Chronic/Pre-existing            O10.019   (procardia)   Advanced maternal age multigravida 1635+,        83O09.523   third trimester (Declined GC)   [redacted] weeks gestation of pregnancy                Z3A.38   Pre-existing diabetes, type 2, in pregnancy,   O24.113   third trimester (Insulin and Metformin) (Echo   results in care everywhere)   Previous cesarean delivery, antepartum x 2     O34.219  ---------------------------------------------------------------------- Vital Signs                                                 Height:        5'10" ---------------------------------------------------------------------- Fetal Evaluation  Num Of Fetuses:         1  Fetal Heart Rate(bpm):  144  Cardiac Activity:       Observed  Presentation:           Cephalic  Placenta:               Posterior  P. Cord Insertion:      Previously Visualized  Amniotic Fluid  AFI FV:      Within normal limits  AFI Sum(cm)     %Tile       Largest Pocket(cm)  15.28           59          6.2  RUQ(cm)       RLQ(cm)       LUQ(cm)        LLQ(cm)  1.85          5.48          6.2            1.75 ---------------------------------------------------------------------- Biophysical Evaluation  Amniotic F.V:   Within normal limits       F. Tone:        Observed  F. Movement:    Observed                   Score:          8/8  F. Breathing:   Observed ---------------------------------------------------------------------- OB History  Gravidity:    5         Term:   0        Prem:   3        SAB:   0  TOP:  0       Ectopic:  0        Living: 3 ---------------------------------------------------------------------- Gestational Age  LMP:           38w 1d        Date:  05/08/18                 EDD:   02/12/19  Best:          38w 1d     Det. By:  LMP  (05/08/18)          EDD:   02/12/19 ---------------------------------------------------------------------- Anatomy  Stomach:               Appears normal, left   Bladder:                Appears normal                          sided ---------------------------------------------------------------------- Impression  Biophysical profile 8/8  CHTN  T2DM ---------------------------------------------------------------------- Recommendations  Continue 2x weekly testing  Consider delivery by 39 weeks. ----------------------------------------------------------------------               Lin Landsman, MD Electronically Signed Final Report   01/31/2019 09:12 am ----------------------------------------------------------------------  MAU Course  Procedures  MDM -wound check s/p C/S on 02/05/2019 -incision well-approximated with likely keloid developing on right-hand side -some steri-strips still in place -per Dr. Earlene Plater, pt OK to be discharged home with MAU Bps, needs to check BP at home and call office/return to MAU if elevated -pt discharged to home in stable condition  Media Information    Document Information  Photos  CS Incision  02/23/2019 11:20  Attached To:  Hospital Encounter on 02/23/19  Source Information  Demere Dotzler, Odie Sera, NP   [facility]   Orders Placed This Encounter  Procedures   Discharge patient    Order Specific Question:   Discharge disposition    Answer:   01-Home or Self Care [1]    Order Specific Question:   Discharge patient date    Answer:   02/23/2019   No orders of the defined types were placed in this encounter.  Assessment and Plan   1. Visit for wound check   2. S/P repeat low transverse C-section   3. Postpartum state    Allergies as of 02/23/2019      Reactions   Chlorine Hives, Other (See Comments)   Skin peels   Mushroom Ext Cmplx(shiitake-reishi-mait) Nausea And Vomiting   Sulfa Antibiotics Other (See Comments)   Turned eyes blood shot and teared blood   Citric Acid Rash, Other (See Comments)   Skin peels   Penicillins Itching, Nausea And Vomiting   Did it involve swelling of the face/tongue/throat, SOB, or low BP? No Did it involve sudden or  severe rash/hives, skin peeling, or any reaction on the inside of your mouth or nose? No Did you need to seek medical attention at a hospital or doctor's office? Yes When did it last happen?10+ years If all above answers are NO, may proceed with cephalosporin use.      Medication List    TAKE these medications   amLODipine 10 MG tablet Commonly known as: NORVASC Take 1 tablet (10 mg total) by mouth daily.   aspirin EC 81 MG tablet Take 1 tablet (81 mg total) by mouth daily.   Ferrous Fumarate 324 (106 Fe) MG Tabs tablet Commonly known as: HEMOCYTE -  106 mg FE Take 1 tablet (106 mg of iron total) by mouth daily.   ibuprofen 800 MG tablet Commonly known as: ADVIL Take 1 tablet (800 mg total) by mouth every 8 (eight) hours as needed for mild pain.   Levemir FlexTouch 100 UNIT/ML Pen Generic drug: Insulin Detemir Inject 10 Units into the skin at bedtime.   oxyCODONE 5 MG immediate release tablet Commonly known as: Oxy IR/ROXICODONE Take 1-2 tablets (5-10 mg total) by mouth every 4 (four) hours as needed for moderate pain.   PRENATAL VITAMIN PO Take 1 tablet by mouth daily.   vitamin C 1000 MG tablet Take 1,000 mg by mouth daily.      -discussed s/sx of infection/return MAU precautions -discussed taking pain medicine on a schedule, rather than waiting for pain to appear -discussed expected healing process for C/S and postpartum uterine involution, pt advised could experience pain along scar for months after surgery -pt advised to leave steri-strips in place until they fall off -pt advised to keep postpartum appt -pt to check BP at home and if elevated, or develops s/sx of preeclampsia to call office/return to MAU -pt discharged to home in stable condition  Elmyra Ricks E Cariah Salatino 02/23/2019, 11:50 AM

## 2019-03-04 ENCOUNTER — Telehealth: Payer: Self-pay | Admitting: Student

## 2019-03-04 ENCOUNTER — Telehealth: Payer: Self-pay | Admitting: Obstetrics & Gynecology

## 2019-03-04 NOTE — Telephone Encounter (Signed)
Attempted to reach patient about her appointment. Left a message for her to call the office.  °

## 2019-03-04 NOTE — Telephone Encounter (Signed)
The patient called to verify the appointment, completed the pre-screen. The patient answered no to COVID19 symptoms and/or being previously diagnosed. Informed the patient of the wearing a face mask, sanitizing hands at the sanitizing station upon entering our office, and no visitors or children are allowed due to the [place] restrictions. The patient verbalized understanding.

## 2019-03-05 ENCOUNTER — Ambulatory Visit (INDEPENDENT_AMBULATORY_CARE_PROVIDER_SITE_OTHER): Payer: BLUE CROSS/BLUE SHIELD | Admitting: Obstetrics & Gynecology

## 2019-03-05 ENCOUNTER — Encounter: Payer: Self-pay | Admitting: Obstetrics & Gynecology

## 2019-03-05 ENCOUNTER — Ambulatory Visit: Payer: BLUE CROSS/BLUE SHIELD | Admitting: Nurse Practitioner

## 2019-03-05 ENCOUNTER — Other Ambulatory Visit: Payer: Self-pay

## 2019-03-05 DIAGNOSIS — Z30014 Encounter for initial prescription of intrauterine contraceptive device: Secondary | ICD-10-CM

## 2019-03-05 DIAGNOSIS — M545 Low back pain, unspecified: Secondary | ICD-10-CM

## 2019-03-05 LAB — POCT PREGNANCY, URINE: Preg Test, Ur: NEGATIVE

## 2019-03-05 MED ORDER — NAPROXEN 500 MG PO TABS: 500.0000 mg | ORAL_TABLET | Freq: Two times a day (BID) | ORAL | 2 refills | Status: DC

## 2019-03-05 MED ORDER — CYCLOBENZAPRINE HCL 10 MG PO TABS: 10.0000 mg | ORAL_TABLET | Freq: Three times a day (TID) | ORAL | 2 refills | Status: DC | PRN

## 2019-03-05 NOTE — Patient Instructions (Signed)
Intrauterine Device Insertion An intrauterine device (IUD) is a medical device that gets inserted into the uterus to prevent pregnancy. It is a small, T-shaped device that has one or two nylon strings hanging down from it. The strings hang out of the lower part of the uterus (cervix) to allow for future IUD removal. There are two types of IUDs available:  Copper IUD. This type of IUD has copper wire wrapped around it. Copper makes the uterus and fallopian tubes produce a fluid that kills sperm. A copper IUD may last up to 10 years.  Hormone IUD. This type of IUD is made of plastic and contains the hormone progestin (synthetic progesterone). The hormone thickens mucus in the cervix and prevents sperm from entering the uterus. It also thins the uterine lining to prevent implantation of a fertilized egg. The hormone can weaken or kill the sperm that get into the uterus. A hormone IUD may last 3-5 years. Tell a health care provider about:  Any allergies you have.  All medicines you are taking, including vitamins, herbs, eye drops, creams, and over-the-counter medicines.  Any problems you or family members have had with anesthetic medicines.  Any blood disorders you have.  Any surgeries you have had.  Any medical conditions you have, including any STIs (sexually transmitted infections) you may have.  Whether you are pregnant or may be pregnant. What are the risks? Generally, this is a safe procedure. However, problems may occur, including:  Infection.  Bleeding.  Allergic reactions to medicines.  Accidental puncture (perforation) of the uterus, or damage to other structures or organs.  Accidental placement of the IUD either in the muscle layer of the uterus (myometrium) or outside the uterus.  The IUD falling out of the uterus (expulsion). This is more common among women who have recently had a child.  Pregnancy that happens in the fallopian tube (ectopic pregnancy).  Infection of  the uterus and fallopian tubes (pelvic inflammatory disease). What happens before the procedure?  Schedule the IUD insertion for when you will have your menstrual period or right after, to make sure you are not pregnant. Placement of the IUD is better tolerated shortly after a menstrual cycle.  Follow instructions from your health care provider about eating or drinking restrictions.  Ask your health care provider about changing or stopping your regular medicines. This is especially important if you are taking diabetes medicines or blood thinners.  You may get a pain reliever to take before the procedure.  You may have tests for: ? Pregnancy. A pregnancy test involves having a urine sample taken. ? STIs. Placing an IUD in someone who has an STI can make the infection worse. ? Cervical cancer. You may have a Pap test to check for this type of cancer. This means collecting cells from your cervix to be examined under a microscope.  You may have a physical exam to determine the size and position of your uterus. The procedure may vary among health care providers and hospitals. What happens during the procedure?  A tool (speculum) will be placed in your vagina and widened so that your health care provider can see your cervix.  Medicine may be applied to your cervix to help lower your risk of infection (antiseptic medicine).  You may be given an anesthetic medicine to numb each side of your cervix (intracervical block or paracervical block). This medicine is usually given by an injection into the cervix.  A tool (uterine sound) will be inserted into your   uterus to determine the length of your uterus and the direction that your uterus may be tilted.  A slim instrument or tube (IUD inserter) that holds the IUD will be inserted into your vagina, through your cervical canal, and into your uterus.  The IUD will be placed in the uterus, and the IUD inserter will be removed.  The strings that are  attached to the IUD will be trimmed so that they lie just below the cervix. The procedure may vary among health care providers and hospitals. What happens after the procedure?  You may have bleeding after the procedure. This is normal. It varies from light bleeding (spotting) for a few days to menstrual-like bleeding.  You may have cramping and pain.  You may feel dizzy or light-headed.  You may have lower back pain. Summary  An intrauterine device (IUD) is a small, T-shaped device that has one or two nylon strings hanging down from it.  Two types of IUDs are available. You may have a copper IUD or a hormone IUD.  Schedule the IUD insertion for when you will have your menstrual period or right after, to make sure you are not pregnant. Placement of the IUD is better tolerated shortly after a menstrual cycle.  You may have bleeding after the procedure. This is normal. It varies from light spotting for a few days to menstrual-like bleeding. This information is not intended to replace advice given to you by your health care provider. Make sure you discuss any questions you have with your health care provider. Document Released: 04/12/2011 Document Revised: 07/27/2017 Document Reviewed: 07/05/2016 Elsevier Patient Education  2020 [employer].    Intrauterine Device Information An intrauterine device (IUD) is a medical device that is inserted in the uterus to prevent pregnancy. It is a small, T-shaped device that has one or two nylon strings hanging down from it. The strings hang out of the lower part of the uterus (cervix) to allow for future IUD removal. There are two types of IUDs available:  Hormone IUD. This type of IUD is made of plastic and contains the hormone progestin (synthetic progesterone). A hormone IUD may last 3-5 years.  Copper IUD. This type of IUD has copper wire wrapped around it. A copper IUD may last up to 10 years. How is an IUD inserted? An IUD is inserted through  the vagina and placed into the uterus with a minor medical procedure. The exact procedure for IUD insertion may vary among health care providers and hospitals. How does an IUD work? Synthetic progesterone in a hormonal IUD prevents pregnancy by:  Thickening cervical mucus to prevent sperm from entering the uterus.  Thinning the uterine lining to prevent a fertilized egg from being implanted there. Copper in a copper IUD prevents pregnancy by making the uterus and fallopian tubes produce a fluid that kills sperm. What are the advantages of an IUD? Advantages of either type of IUD  It is highly effective in preventing pregnancy.  It is reversible. You can become pregnant shortly after the IUD is removed.  It is low-maintenance and can stay in place for a long time.  There are no estrogen-related side effects.  It can be used when breastfeeding.  It is not associated with weight gain.  It can be inserted right after childbirth, an abortion, or a miscarriage. Advantages of a hormone IUD  If it is inserted within 7 days of your period starting, it works right after it is inserted. If the hormone  IUD is inserted at any other time in your cycle, you will need to use a backup method of birth control for 7 days after insertion.  It can make menstrual periods lighter.  It can reduce menstrual cramping.  It can be used for 3-5 years. Advantages of a copper IUD  It works right after it is inserted.  It can be used as a form of emergency birth control if it is inserted within 5 days after having unprotected sex.  It does not interfere with your body's natural hormones.  It can be used for 10 years. What are the disadvantages of an IUD?  An IUD may cause irregular menstrual bleeding for a period of time after insertion.  You may have pain during insertion and have cramping and vaginal bleeding after insertion.  An IUD may cut the uterus (uterine perforation) when it is inserted.  This is rare.  An IUD may cause pelvic inflammatory disease (PID), which is an infection in the uterus and fallopian tubes. This is rare, and it usually happens during the first 20 days after the IUD is inserted.  A copper IUD can make your menstrual flow heavier and more painful. How is an IUD removed?  You will lie on your back with your knees bent and your feet in footrests (stirrups).  A device will be inserted into your vagina to spread apart the vaginal walls (speculum). This will allow your health care provider to see the strings attached to the IUD.  Your health care provider will use a small instrument (forceps) to grasp the IUD strings and pull firmly until the IUD is removed. You may have some discomfort when the IUD is removed. Your health care provider may recommend taking over-the-counter pain relievers, such as ibuprofen, before the procedure. You may also have minor spotting for a few days after the procedure. The exact procedure for IUD removal may vary among health care providers and hospitals. Is the IUD right for me? Your health care provider will make sure you are a good candidate for an IUD and will discuss the advantages, disadvantages, and possible side effects with you. Summary  An intrauterine device (IUD) is a medical device that is inserted in the uterus to prevent pregnancy. It is a small, T-shaped device that has one or two nylon strings hanging down from it.  A hormone IUD contains the hormone progestin (synthetic progesterone). A copper IUD has copper wire wrapped around it.  Synthetic progesterone in a hormone IUD prevents pregnancy by thickening cervical mucus and thinning the walls of the uterus. Copper in a copper IUD prevents pregnancy by making the uterus and fallopian tubes produce a fluid that kills sperm.  A hormone IUD can be left in place for 3-5 years. A copper IUD can be left in place for up to 10 years.  An IUD is inserted and removed by a  health care provider. You may feel some pain during insertion and removal. Your health care provider may recommend taking over-the-counter pain medicine, such as ibuprofen, before an IUD procedure. This information is not intended to replace advice given to you by your health care provider. Make sure you discuss any questions you have with your health care provider. Document Released: 07/18/2004 Document Revised: 07/27/2017 Document Reviewed: 09/12/2016 Elsevier Patient Education  2020 [employer]

## 2019-03-05 NOTE — Progress Notes (Signed)
Subjective:     Yvette Patel is a 43 y.o. [phone] female who presents for a postpartum visit. She is 4 weeks postpartum following a repeat cesearean section at [redacted]w[redacted]d. I have fully reviewed the prenatal and intrapartum course; she had the following issues during this pregnancy. Patient Active Problem List   Diagnosis Date Noted  . Status post repeat low transverse cesarean section 02/05/2019  . GBS (group B Streptococcus carrier), +RV culture, currently pregnant 01/17/2019  . AMA (advanced maternal age) multigravida 35+ 01/06/2019  . Obesity in pregnancy 01/06/2019  . Chronic hypertension 10/31/2018  . Supervision of high risk pregnancy, antepartum 09/23/2018  . Previous cesarean delivery, antepartum 09/23/2018  . History of preterm delivery 09/23/2018  . Diabetes mellitus complicating pregnancy 46/27/0350  . Diabetic retinopathy ([place])   . 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 Postpartum course has been remarkable for having moderate lower back pain around spinal site, not totally alleviated by Ibuprofen. No fevers, no radiation. Baby's course has been unremarkable. Baby is feeding by breast. Bleeding staining only. Bowel function is normal. Bladder function is normal. Patient is not sexually active. Desired contraception method is Paragard IUD. Postpartum depression screening: negative.  The following portions of the patient's history were reviewed and updated as appropriate: allergies, current medications, past family history, past medical history, past social history, past surgical history and problem list. Normal pap in 2018 as per patient.  Review of Systems Pertinent items noted in HPI and remainder of comprehensive ROS otherwise negative.   Objective:    BP (!) 145/81   Pulse 91   Wt 231 lb 4.8 oz (104.9 kg)   BMI 33.19 kg/m   General:  alert and no distress   Breasts:  inspection negative, no nipple discharge or bleeding, no  masses or nodularity palpable  Lungs: clear to auscultation bilaterally  Heart:  regular rate and rhythm  Abdomen: abnormal findings:  mild tenderness in the lower abdomen, no rebound or guarding. Incision is well-healed.  Pelvic:  not evaluated        Assessment:     Normal postpartum exam. Pap smear not done at today's visit.   Plan:     1. Back pain at L4-L5 level Will give analgesia and muscle relaxant for now. If persists/worsens, may need to be referred for further evaluation.  Will continue observation. Reevaluate in one month. - naproxen (NAPROSYN) 500 MG tablet; Take 1 tablet (500 mg total) by mouth 2 (two) times daily with a meal. As needed for pain  Dispense: 60 tablet; Refill: 2 - cyclobenzaprine (FLEXERIL) 10 MG tablet; Take 1 tablet (10 mg total) by mouth 3 (three) times daily as needed for muscle spasms.  Dispense: 30 tablet; Refill: 2  2. Evaluation for intrauterine contraception Patient was counseled about IUD insertion and side effects; emphasized irregular bleeding and cramping for weeks/months.  She feels she is already in pain, does not want to be in more pain, wants to defer this placement until one month from now. Advised to abstain from intercourse or use condoms until then. Counseled about Paragard vs Liletta, patient is considering getting a Liletta instead.   3. Postpartum care following cesarean delivery Stable BP. Will follow up with PCP for management of CHTN and DM. No other concerns.  Return in about 1 month (around 04/05/2019) for IUD placement.   Verita Schneiders, MD, [place] for [employer], [place]

## 2019-03-05 NOTE — Progress Notes (Deleted)
Patient is here for PP visit and Paragard placement

## 2019-03-13 ENCOUNTER — Encounter: Payer: Self-pay | Admitting: *Deleted

## 2019-04-14 ENCOUNTER — Other Ambulatory Visit: Payer: Self-pay

## 2019-04-14 ENCOUNTER — Encounter: Payer: Self-pay | Admitting: Obstetrics & Gynecology

## 2019-04-14 ENCOUNTER — Ambulatory Visit (INDEPENDENT_AMBULATORY_CARE_PROVIDER_SITE_OTHER): Payer: BLUE CROSS/BLUE SHIELD | Admitting: Obstetrics & Gynecology

## 2019-04-14 VITALS — BP 131/83 | HR 97 | Temp 98.0°F | Ht 70.0 in | Wt 236.6 lb

## 2019-04-14 DIAGNOSIS — Z30430 Encounter for insertion of intrauterine contraceptive device: Secondary | ICD-10-CM | POA: Diagnosis not present

## 2019-04-14 LAB — POCT PREGNANCY, URINE: Preg Test, Ur: NEGATIVE

## 2019-04-14 MED ORDER — LEVONORGESTREL 19.5 MCG/DAY IU IUD: INTRAUTERINE_SYSTEM | Freq: Once | INTRAUTERINE | Status: AC

## 2019-04-14 NOTE — Patient Instructions (Signed)

## 2019-04-14 NOTE — Progress Notes (Signed)
    GYNECOLOGY OFFICE PROCEDURE NOTE  Yvette Patel is a 43 y.o. [id number] here for [place] IUD insertion. No GYN concerns.  Last pap smear was at outside facility in 2018 and was normal per patient.   IUD Insertion Procedure Note Patient identified, informed consent performed, consent signed.   Discussed risks of irregular bleeding, cramping, infection, malpositioning or misplacement of the IUD outside the uterus which may require further procedure such as laparoscopy. Also discussed >99% contraception efficacy, increased risk of ectopic pregnancy with failure of method.  Time out was performed.  Urine pregnancy test negative.  Speculum placed in the vagina.  Cervix visualized.  Cleaned with Betadine x 2.  Grasped anteriorly with a single tooth tenaculum.  Liletta IUD placed per manufacturer's recommendations.  Strings trimmed to 3 cm. Tenaculum was removed, good hemostasis noted.  Patient tolerated procedure well.   Patient was given post-procedure instructions.  She was advised to have backup contraception for one week.  Patient was also asked to check IUD strings periodically and follow up in 4 weeks for IUD check.  LOT #: [id number] Exp: [id number]  Barrington Ellison, MD OB Family Medicine Fellow, [facility] for [employer], [place]

## 2019-04-17 ENCOUNTER — Encounter: Payer: Self-pay | Admitting: General Practice

## 2019-05-22 ENCOUNTER — Telehealth: Payer: Self-pay | Admitting: Obstetrics & Gynecology

## 2019-05-22 NOTE — Telephone Encounter (Signed)
Attempted to call patient about her appointment on 9/25 @ 10:15. No answer and could not leave a message because the number stated that the call could not be completed at this time.

## 2019-05-23 ENCOUNTER — Ambulatory Visit: Payer: BLUE CROSS/BLUE SHIELD | Admitting: Obstetrics & Gynecology

## 2019-06-03 ENCOUNTER — Other Ambulatory Visit: Payer: Self-pay | Admitting: *Deleted

## 2019-06-03 DIAGNOSIS — M545 Low back pain, unspecified: Secondary | ICD-10-CM

## 2019-06-03 NOTE — Telephone Encounter (Signed)
Received refill faxes for Naproxen and Cyclobenzaprine. Will route to provider.  Giavanna Kang,RN

## 2019-06-04 MED ORDER — NAPROXEN 500 MG PO TABS: 500.0000 mg | ORAL_TABLET | Freq: Two times a day (BID) | ORAL | 2 refills | Status: AC

## 2019-06-04 MED ORDER — CYCLOBENZAPRINE HCL 10 MG PO TABS: 10.0000 mg | ORAL_TABLET | Freq: Three times a day (TID) | ORAL | 2 refills | Status: AC | PRN

## 2019-06-06 ENCOUNTER — Other Ambulatory Visit: Payer: Self-pay

## 2019-06-06 DIAGNOSIS — Z20822 Contact with and (suspected) exposure to covid-19: Secondary | ICD-10-CM

## 2019-06-08 LAB — NOVEL CORONAVIRUS, NAA: SARS-CoV-2, NAA: NOT DETECTED

## 2019-08-09 ENCOUNTER — Other Ambulatory Visit: Payer: Self-pay | Admitting: Advanced Practice Midwife

## 2019-09-03 ENCOUNTER — Other Ambulatory Visit: Payer: Self-pay

## 2019-09-03 DIAGNOSIS — Z20822 Contact with and (suspected) exposure to covid-19: Secondary | ICD-10-CM

## 2019-09-04 LAB — NOVEL CORONAVIRUS, NAA: SARS-CoV-2, NAA: NOT DETECTED

## 2019-10-24 ENCOUNTER — Ambulatory Visit: Payer: BLUE CROSS/BLUE SHIELD | Attending: Internal Medicine

## 2019-10-24 DIAGNOSIS — Z20822 Contact with and (suspected) exposure to covid-19: Secondary | ICD-10-CM

## 2019-10-27 ENCOUNTER — Other Ambulatory Visit: Payer: Self-pay

## 2019-10-27 ENCOUNTER — Ambulatory Visit: Payer: BLUE CROSS/BLUE SHIELD | Attending: Internal Medicine

## 2019-10-27 DIAGNOSIS — Z20822 Contact with and (suspected) exposure to covid-19: Secondary | ICD-10-CM

## 2019-10-27 LAB — NOVEL CORONAVIRUS, NAA

## 2019-10-28 LAB — NOVEL CORONAVIRUS, NAA: SARS-CoV-2, NAA: NOT DETECTED

## 2019-12-11 ENCOUNTER — Encounter: Payer: Self-pay | Admitting: *Deleted

## 2020-03-23 ENCOUNTER — Encounter (HOSPITAL_COMMUNITY): Payer: Self-pay | Admitting: *Deleted

## 2020-03-23 ENCOUNTER — Emergency Department (HOSPITAL_COMMUNITY)
Admission: EM | Admit: 2020-03-23 | Discharge: 2020-03-24 | Disposition: A | Discharge status: Home / Self Care | Payer: BLUE CROSS/BLUE SHIELD | Attending: Emergency Medicine | Admitting: Emergency Medicine

## 2020-03-23 ENCOUNTER — Emergency Department (HOSPITAL_COMMUNITY): Payer: BLUE CROSS/BLUE SHIELD

## 2020-03-23 ENCOUNTER — Other Ambulatory Visit: Payer: Self-pay

## 2020-03-23 DIAGNOSIS — R0602 Shortness of breath: Secondary | ICD-10-CM | POA: Insufficient documentation

## 2020-03-23 DIAGNOSIS — M542 Cervicalgia: Secondary | ICD-10-CM | POA: Insufficient documentation

## 2020-03-23 DIAGNOSIS — R0789 Other chest pain: Secondary | ICD-10-CM | POA: Insufficient documentation

## 2020-03-23 DIAGNOSIS — Z5321 Procedure and treatment not carried out due to patient leaving prior to being seen by health care provider: Secondary | ICD-10-CM | POA: Insufficient documentation

## 2020-03-23 DIAGNOSIS — R42 Dizziness and giddiness: Secondary | ICD-10-CM | POA: Diagnosis not present

## 2020-03-23 LAB — TROPONIN I (HIGH SENSITIVITY): Troponin I (High Sensitivity): 3 ng/L (ref <18)

## 2020-03-23 LAB — CBC
HCT: 36.5 % (ref 36.0–46.0)
Hemoglobin: 11.1 g/dL — ABNORMAL LOW (ref 12.0–15.0)
MCH: 26.7 pg (ref 26.0–34.0)
MCHC: 30.4 g/dL (ref 30.0–36.0)
MCV: 87.7 fL (ref 80.0–100.0)
Platelets: 213 10*3/uL (ref 150–400)
RBC: 4.16 MIL/uL (ref 3.87–5.11)
RDW: 14.6 % (ref 11.5–15.5)
WBC: 9.2 10*3/uL (ref 4.0–10.5)
nRBC: 0 % (ref 0.0–0.2)

## 2020-03-23 LAB — BASIC METABOLIC PANEL
Anion gap: 8 (ref 5–15)
BUN: 14 mg/dL (ref 6–20)
CO2: 26 mmol/L (ref 22–32)
Calcium: 9.4 mg/dL (ref 8.9–10.3)
Chloride: 103 mmol/L (ref 98–111)
Creatinine, Ser: 1.04 mg/dL — ABNORMAL HIGH (ref 0.44–1.00)
GFR calc Af Amer: >60 mL/min (ref >60)
GFR calc non Af Amer: >60 mL/min (ref >60)
Glucose, Bld: 207 mg/dL — ABNORMAL HIGH (ref 70–99)
Potassium: 4.4 mmol/L (ref 3.5–5.1)
Sodium: 137 mmol/L (ref 135–145)

## 2020-03-23 LAB — I-STAT BETA HCG BLOOD, ED (MC, WL, AP ONLY): I-stat hCG, quantitative: <5 m[IU]/mL (ref <5)

## 2020-03-23 MED ORDER — SODIUM CHLORIDE 0.9% FLUSH: 3.0000 mL | Freq: Once | INTRAVENOUS | Status: DC

## 2020-03-23 NOTE — ED Triage Notes (Signed)
Constant left lateral chest pain x 2 days. Associated with SOB, dizziness, and anterior neck pain. No cough.

## 2020-03-24 ENCOUNTER — Other Ambulatory Visit: Payer: BLUE CROSS/BLUE SHIELD

## 2020-03-24 LAB — TROPONIN I (HIGH SENSITIVITY): Troponin I (High Sensitivity): 4 ng/L (ref <18)

## 2020-03-24 NOTE — ED Notes (Signed)
Pt left due to not being seen quick enough 

## 2020-03-29 ENCOUNTER — Emergency Department (HOSPITAL_COMMUNITY): Payer: BLUE CROSS/BLUE SHIELD

## 2020-03-29 ENCOUNTER — Other Ambulatory Visit: Payer: Self-pay

## 2020-03-29 ENCOUNTER — Emergency Department (HOSPITAL_COMMUNITY)
Admission: EM | Admit: 2020-03-29 | Discharge: 2020-03-29 | Disposition: A | Discharge status: Home / Self Care | Payer: BLUE CROSS/BLUE SHIELD | Attending: Emergency Medicine | Admitting: Emergency Medicine

## 2020-03-29 ENCOUNTER — Encounter (HOSPITAL_COMMUNITY): Payer: Self-pay | Admitting: Emergency Medicine

## 2020-03-29 DIAGNOSIS — Z79899 Other long term (current) drug therapy: Secondary | ICD-10-CM | POA: Diagnosis not present

## 2020-03-29 DIAGNOSIS — I10 Essential (primary) hypertension: Secondary | ICD-10-CM | POA: Diagnosis not present

## 2020-03-29 DIAGNOSIS — Z87891 Personal history of nicotine dependence: Secondary | ICD-10-CM | POA: Diagnosis not present

## 2020-03-29 DIAGNOSIS — Y939 Activity, unspecified: Secondary | ICD-10-CM | POA: Insufficient documentation

## 2020-03-29 DIAGNOSIS — S199XXA Unspecified injury of neck, initial encounter: Secondary | ICD-10-CM | POA: Diagnosis present

## 2020-03-29 DIAGNOSIS — Y999 Unspecified external cause status: Secondary | ICD-10-CM | POA: Diagnosis not present

## 2020-03-29 DIAGNOSIS — S161XXA Strain of muscle, fascia and tendon at neck level, initial encounter: Secondary | ICD-10-CM | POA: Diagnosis not present

## 2020-03-29 DIAGNOSIS — E11319 Type 2 diabetes mellitus with unspecified diabetic retinopathy without macular edema: Secondary | ICD-10-CM | POA: Insufficient documentation

## 2020-03-29 DIAGNOSIS — Y92410 Unspecified street and highway as the place of occurrence of the external cause: Secondary | ICD-10-CM | POA: Insufficient documentation

## 2020-03-29 DIAGNOSIS — Z794 Long term (current) use of insulin: Secondary | ICD-10-CM | POA: Insufficient documentation
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
## 2020-03-29 NOTE — ED Notes (Signed)
Verbalized understanding of DC instructions and follow up care 

## 2020-03-30 ENCOUNTER — Other Ambulatory Visit: Payer: BLUE CROSS/BLUE SHIELD

## 2020-03-30 ENCOUNTER — Other Ambulatory Visit: Payer: Self-pay

## 2020-03-30 DIAGNOSIS — Z20822 Contact with and (suspected) exposure to covid-19: Secondary | ICD-10-CM

## 2020-03-31 LAB — SARS-COV-2, NAA 2 DAY TAT

## 2020-03-31 LAB — NOVEL CORONAVIRUS, NAA: SARS-CoV-2, NAA: NOT DETECTED

## 2020-06-12 IMAGING — US US MFM FETAL BPP WO NON STRESS
1 series · 13 of 20 positions shown · non-contrast
Comparison: none

[Series 1: us mfm fetal bpp wo non stress · 20 acquisitions, 13 frames shown]
[im 1/20]
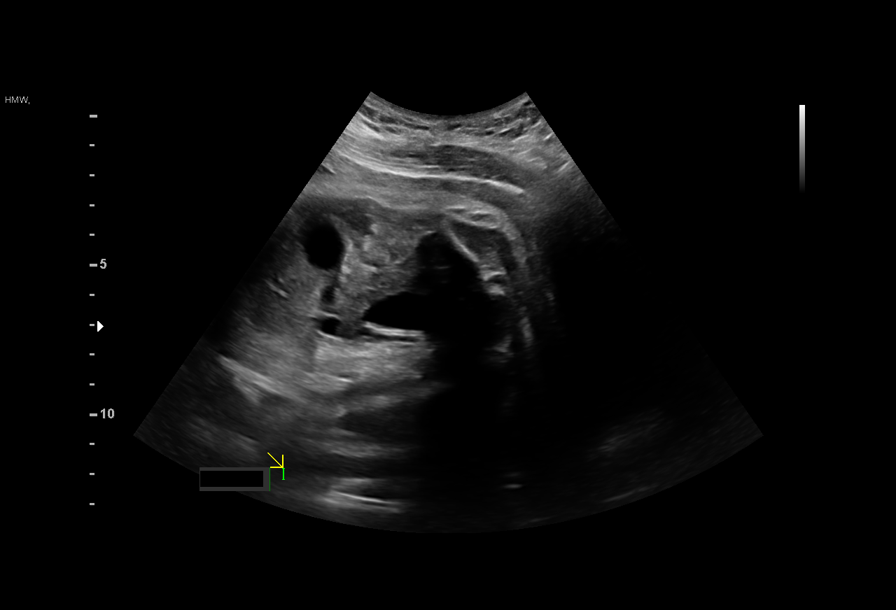
[im 3/20]
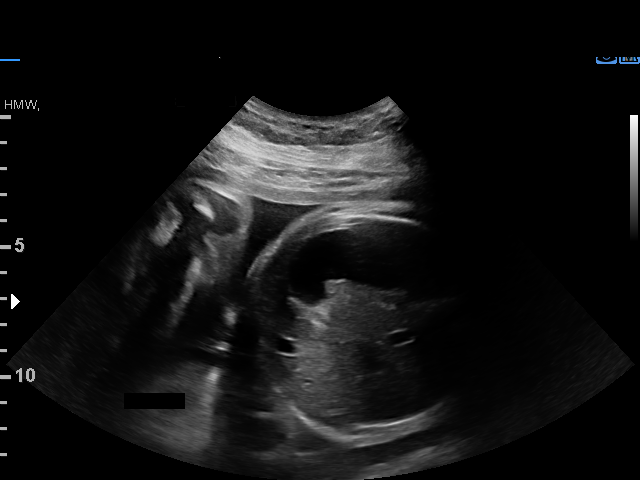
[im 4/20]
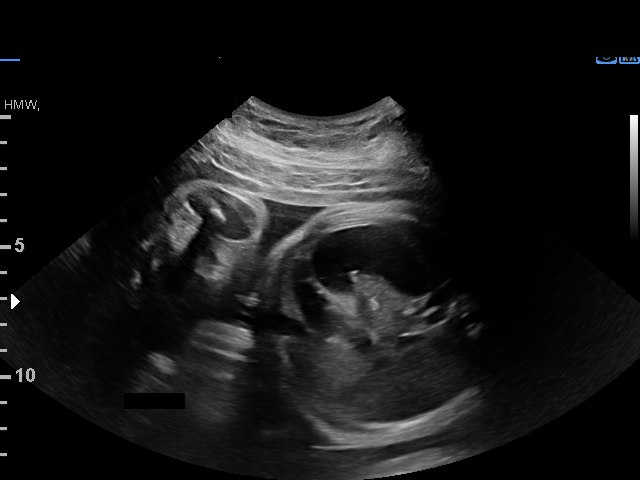
[im 6/20]
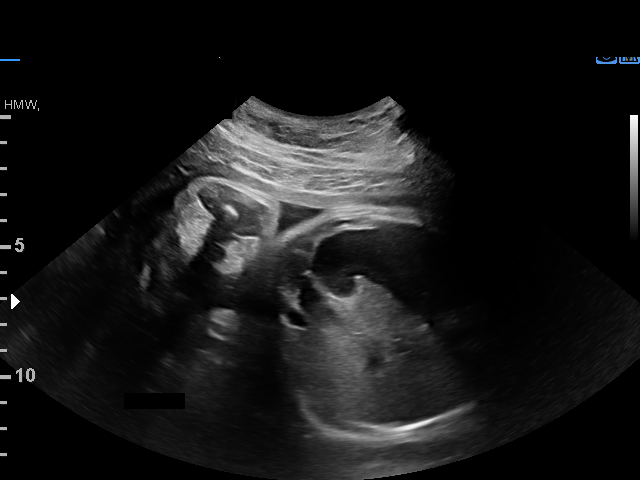
[im 7/20]
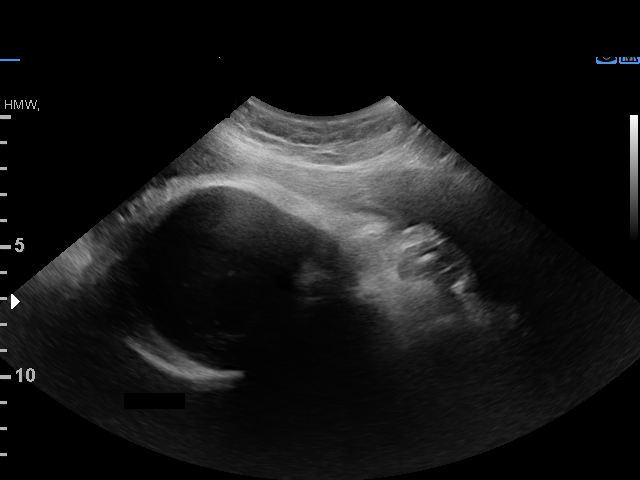
[im 9/20]
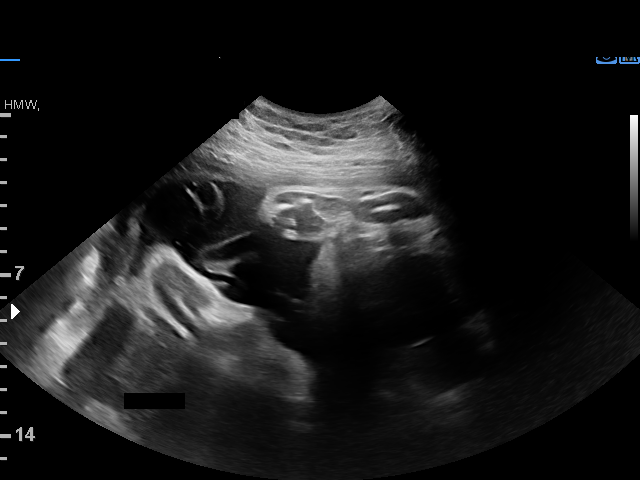
[im 11/20]
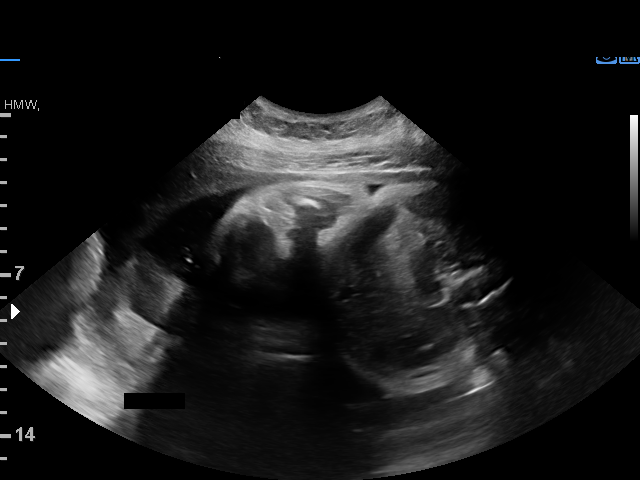
[im 12/20]
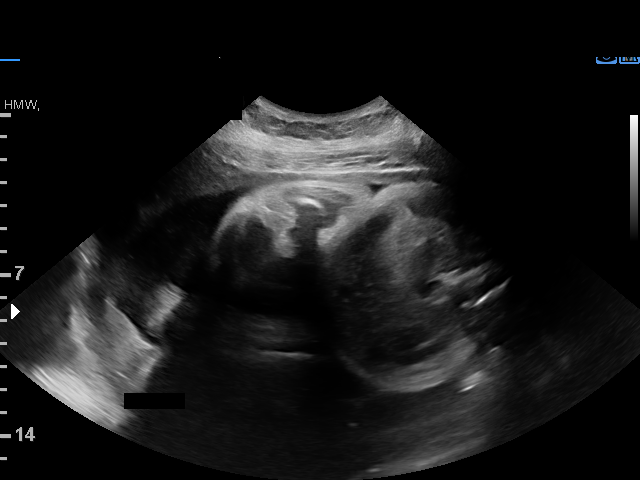
[im 14/20]
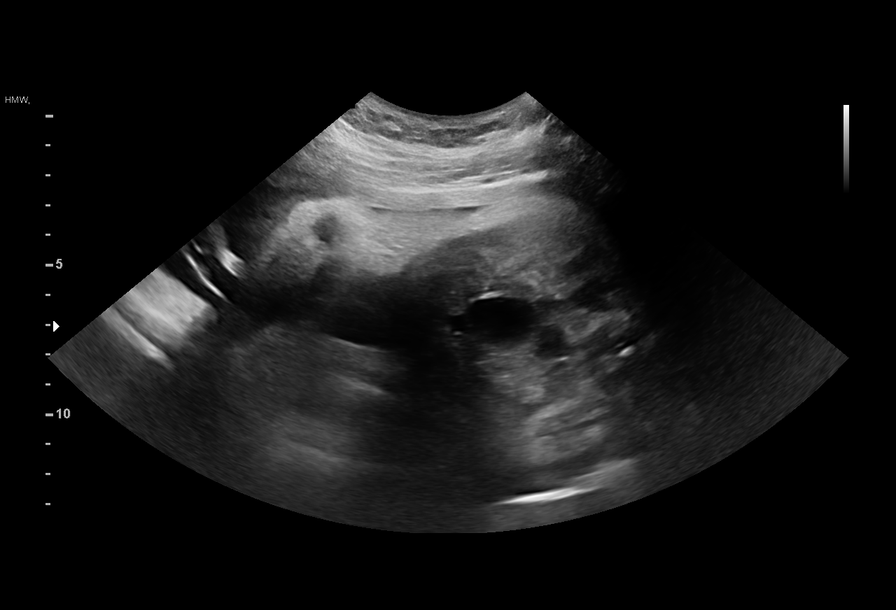
[im 15/20]
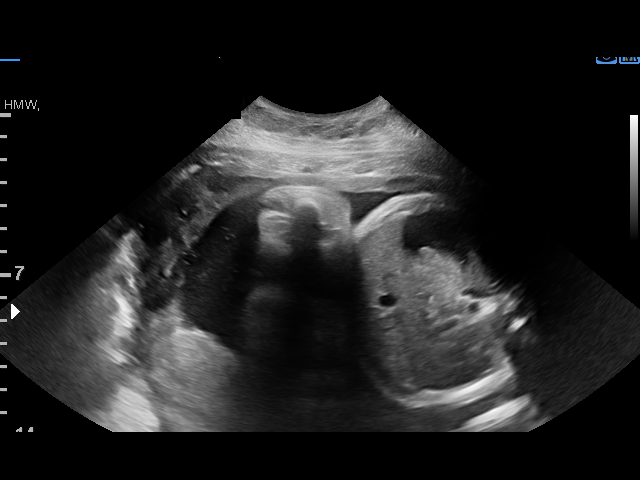
[im 17/20]
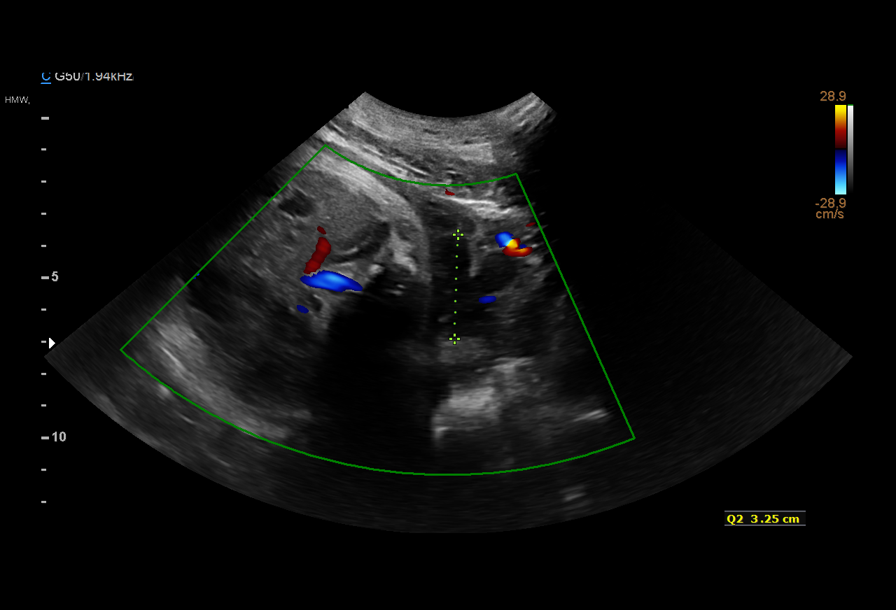
[im 18/20]
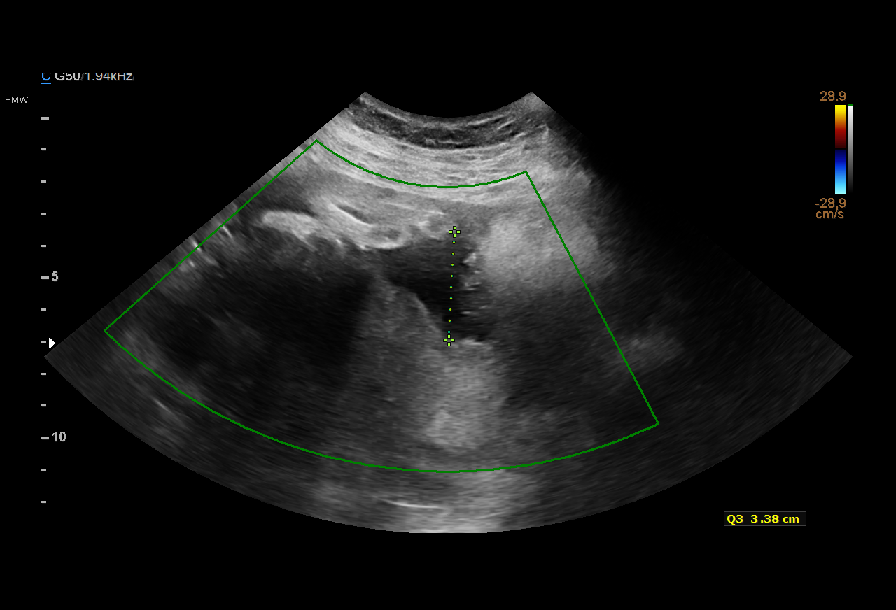
[im 20/20]
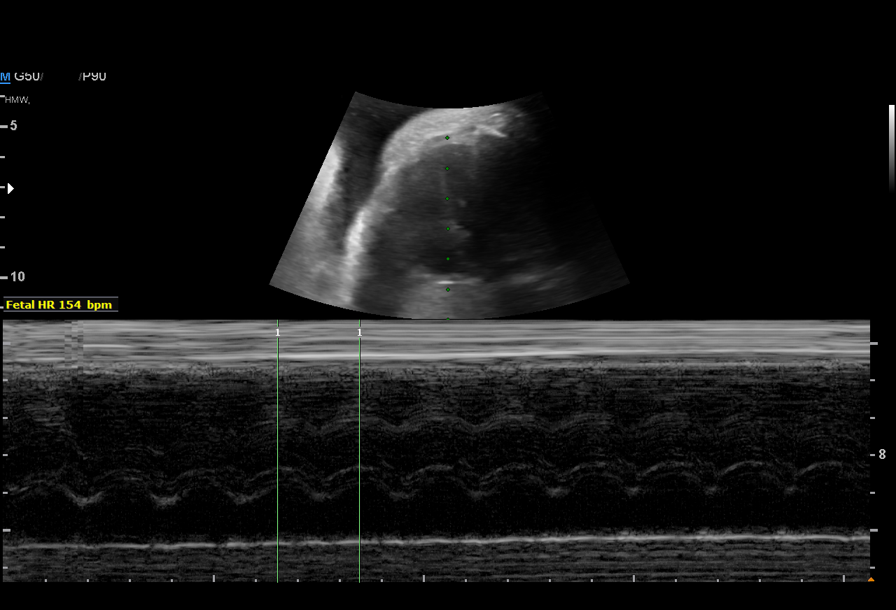

[13 of 20 positions shown; findings below may reference images not displayed]

[address]

 ----------------------------------------------------------------------

 ----------------------------------------------------------------------
Indications

  34 weeks gestation of pregnancy
  Poor obstetric history: Previous preterm
  delivery, antepartum ([DATE] weeks)
  Hypertension - Chronic/Pre-existing
  (procardia)
  Advanced maternal age multigravida 35+,
  third trimester (Declined JAYLON)
  Pre-existing diabetes, type 2, in pregnancy,
  third trimester (Insulin and Metformin) (Echo
  results in [REDACTED])
 ----------------------------------------------------------------------
Vital Signs

                                                Height:        5'10"
Fetal Evaluation

 Num Of Fetuses:         1
 Fetal Heart Rate(bpm):  154
 Cardiac Activity:       Observed
 Presentation:           Breech

 Amniotic Fluid
 AFI FV:      Within normal limits

 AFI Sum(cm)     %Tile       Largest Pocket(cm)
 15.64           56

 RUQ(cm)       RLQ(cm)       LUQ(cm)        LLQ(cm)

Biophysical Evaluation
 Amniotic F.V:   Within normal limits       F. Tone:        Observed
 F. Movement:    Observed                   Score:          [DATE]
 F. Breathing:   Observed
OB History

 Gravidity:    5         Term:   0        Prem:   3        SAB:   0
 TOP:          0       Ectopic:  0        Living: 3
Gestational Age

 LMP:           34w 0d        Date:  05/08/18                 EDD:   02/12/19
 Best:          34w 0d     Det. By:  LMP  (05/08/18)          EDD:   02/12/19
Cervix Uterus Adnexa

 Cervix
 Not visualized (advanced GA >12wks)
Impression

 Amniotic fluid is normal and good fetal activity is seen.
 Antenatal testing is reassuring. BPP [DATE]. Breech presentation.
Recommendations

 -Continue weekly BPP.
                 Abanes, Choi Peng
